# Patient Record
Sex: Male | Born: 1940
Health system: Southern US, Community
[De-identification: ages and names within clinical notes are randomized; demographics above are authoritative.]

## PROBLEM LIST (undated history)

## (undated) DIAGNOSIS — C801 Malignant (primary) neoplasm, unspecified: Secondary | ICD-10-CM

## (undated) DIAGNOSIS — E039 Hypothyroidism, unspecified: Secondary | ICD-10-CM

## (undated) DIAGNOSIS — K8020 Calculus of gallbladder without cholecystitis without obstruction: Secondary | ICD-10-CM

## (undated) DIAGNOSIS — I6529 Occlusion and stenosis of unspecified carotid artery: Secondary | ICD-10-CM

## (undated) DIAGNOSIS — K4090 Unilateral inguinal hernia, without obstruction or gangrene, not specified as recurrent: Secondary | ICD-10-CM

## (undated) DIAGNOSIS — E785 Hyperlipidemia, unspecified: Secondary | ICD-10-CM

## (undated) DIAGNOSIS — K219 Gastro-esophageal reflux disease without esophagitis: Secondary | ICD-10-CM

## (undated) DIAGNOSIS — I10 Essential (primary) hypertension: Secondary | ICD-10-CM

## (undated) DIAGNOSIS — E079 Disorder of thyroid, unspecified: Secondary | ICD-10-CM

## (undated) DIAGNOSIS — E041 Nontoxic single thyroid nodule: Secondary | ICD-10-CM

## (undated) HISTORY — DX: Disorder of thyroid, unspecified: E07.9

## (undated) HISTORY — DX: Nontoxic single thyroid nodule: E04.1

## (undated) HISTORY — DX: Essential (primary) hypertension: I10

## (undated) HISTORY — DX: Hyperlipidemia, unspecified: E78.5

## (undated) HISTORY — PX: HERNIA REPAIR: SHX51

## (undated) HISTORY — PX: CATARACT EXTRACTION W/ INTRAOCULAR LENS  IMPLANT, BILATERAL: SHX1307

## (undated) HISTORY — DX: Hypothyroidism, unspecified: E03.9

## (undated) HISTORY — DX: Gastro-esophageal reflux disease without esophagitis: K21.9

## (undated) HISTORY — PX: TONSILLECTOMY: SUR1361

## (undated) HISTORY — DX: Occlusion and stenosis of unspecified carotid artery: I65.29

---

## 2000-05-25 ENCOUNTER — Ambulatory Visit (HOSPITAL_COMMUNITY): Admission: RE | Admit: 2000-05-25 | Discharge: 2000-05-25 | Payer: Self-pay | Admitting: Family Medicine

## 2000-05-25 ENCOUNTER — Encounter: Payer: Self-pay | Admitting: Family Medicine

## 2004-01-06 ENCOUNTER — Ambulatory Visit (HOSPITAL_COMMUNITY): Admission: RE | Admit: 2004-01-06 | Discharge: 2004-01-06 | Payer: Self-pay | Admitting: Gastroenterology

## 2008-01-31 ENCOUNTER — Emergency Department (HOSPITAL_COMMUNITY): Admission: EM | Admit: 2008-01-31 | Discharge: 2008-01-31 | Payer: Self-pay | Admitting: Emergency Medicine

## 2010-12-07 ENCOUNTER — Ambulatory Visit (HOSPITAL_COMMUNITY)
Admission: RE | Admit: 2010-12-07 | Discharge: 2010-12-07 | Disposition: A | Discharge status: Home / Self Care | Payer: Medicare Other | Source: Ambulatory Visit | Attending: General Surgery | Admitting: General Surgery

## 2010-12-07 ENCOUNTER — Other Ambulatory Visit: Payer: Self-pay | Admitting: General Surgery

## 2010-12-07 ENCOUNTER — Other Ambulatory Visit (HOSPITAL_COMMUNITY): Payer: Self-pay | Admitting: General Surgery

## 2010-12-07 ENCOUNTER — Encounter (HOSPITAL_COMMUNITY): Payer: Medicare Other

## 2010-12-07 DIAGNOSIS — K4090 Unilateral inguinal hernia, without obstruction or gangrene, not specified as recurrent: Secondary | ICD-10-CM | POA: Insufficient documentation

## 2010-12-07 DIAGNOSIS — Z01811 Encounter for preprocedural respiratory examination: Secondary | ICD-10-CM

## 2010-12-07 DIAGNOSIS — M47814 Spondylosis without myelopathy or radiculopathy, thoracic region: Secondary | ICD-10-CM | POA: Insufficient documentation

## 2010-12-07 DIAGNOSIS — Z01812 Encounter for preprocedural laboratory examination: Secondary | ICD-10-CM | POA: Insufficient documentation

## 2010-12-07 DIAGNOSIS — Z01818 Encounter for other preprocedural examination: Secondary | ICD-10-CM | POA: Insufficient documentation

## 2010-12-07 DIAGNOSIS — Z01810 Encounter for preprocedural cardiovascular examination: Secondary | ICD-10-CM | POA: Insufficient documentation

## 2010-12-07 LAB — DIFFERENTIAL
Basophils Absolute: 0 10*3/uL (ref 0.0–0.1)
Basophils Relative: 0 % (ref 0–1)
Eosinophils Absolute: 0.2 10*3/uL (ref 0.0–0.7)
Eosinophils Relative: 2 % (ref 0–5)
Lymphocytes Relative: 32 % (ref 12–46)
Lymphs Abs: 2.2 10*3/uL (ref 0.7–4.0)
Monocytes Absolute: 0.9 10*3/uL (ref 0.1–1.0)
Monocytes Relative: 13 % — ABNORMAL HIGH (ref 3–12)
Neutro Abs: 3.6 10*3/uL (ref 1.7–7.7)
Neutrophils Relative %: 52 % (ref 43–77)

## 2010-12-07 LAB — CBC
HCT: 42.5 % (ref 39.0–52.0)
Hemoglobin: 14.6 g/dL (ref 13.0–17.0)
MCH: 32.3 pg (ref 26.0–34.0)
MCHC: 34.4 g/dL (ref 30.0–36.0)
MCV: 94 fL (ref 78.0–100.0)
Platelets: 200 10*3/uL (ref 150–400)
RBC: 4.52 MIL/uL (ref 4.22–5.81)
RDW: 12.3 % (ref 11.5–15.5)
WBC: 6.9 10*3/uL (ref 4.0–10.5)

## 2010-12-07 LAB — BASIC METABOLIC PANEL
BUN: 15 mg/dL (ref 6–23)
CO2: 30 mEq/L (ref 19–32)
Calcium: 9.4 mg/dL (ref 8.4–10.5)
Chloride: 98 mEq/L (ref 96–112)
Creatinine, Ser: 0.99 mg/dL (ref 0.4–1.5)
GFR calc Af Amer: >60 mL/min (ref >60)
GFR calc non Af Amer: >60 mL/min (ref >60)
Glucose, Bld: 96 mg/dL (ref 70–99)
Potassium: 4.3 mEq/L (ref 3.5–5.1)
Sodium: 134 mEq/L — ABNORMAL LOW (ref 135–145)

## 2010-12-07 LAB — SURGICAL PCR SCREEN
MRSA, PCR: NEGATIVE
Staphylococcus aureus: NEGATIVE

## 2010-12-11 ENCOUNTER — Observation Stay (HOSPITAL_COMMUNITY)
Admission: RE | Admit: 2010-12-11 | Discharge: 2010-12-12 | Disposition: A | Discharge status: Home / Self Care | Payer: Medicare Other | Source: Ambulatory Visit | Attending: General Surgery | Admitting: General Surgery

## 2010-12-11 DIAGNOSIS — K4020 Bilateral inguinal hernia, without obstruction or gangrene, not specified as recurrent: Principal | ICD-10-CM | POA: Insufficient documentation

## 2010-12-27 NOTE — Op Note (Signed)
NAMEELIZANDRO, Mark Ewing             ACCOUNT NO.:  [id number]  MEDICAL RECORD NO.:  [id number]           PATIENT TYPE:  O  LOCATION:  DAYL                         FACILITY:  [facility]  PHYSICIAN:  Mary Sella. Andrey Campanile, MD     DATE OF BIRTH:  10/21/1940  DATE OF PROCEDURE:  12/11/2010 DATE OF DISCHARGE:                              OPERATIVE REPORT   PREOPERATIVE DIAGNOSIS:  Right inguinal hernia.  POSTOPERATIVE DIAGNOSIS:  Bilateral indirect inguinal hernia.  PROCEDURE:  Laparoscopic repair of bilateral indirect inguinal hernia with mesh.  SURGEON:  Mary Sella. Andrey Campanile, MD  ANESTHESIA:  General plus local consisting of 0.25% Marcaine with epi.  FINDINGS:  Upon viewing his pelvis, he had a right indirect inguinal hernia.  He also had a small left indirect inguinal hernia.  I called out to the waiting room and spoke to the patient's wife and son who gave me permission to go ahead and proceed with bilateral hernia repair.  We used 2 pieces of Physio mesh, each was 4 inch x 6 inch, one for each groin.  INDICATIONS FOR PROCEDURE:  The patient is a 69 year old gentleman who developed pain in his right groin about 2-3 weeks ago.  The pain worsened throughout the day and the bulge got larger throughout the daytime.  He was referred by his primary care physician.  We discussed the risks and benefits of hernia repair including bleeding, infection, injury to surrounding structures, testicular loss, chronic inguinal pain, hernia recurrence, hematoma formation, seroma formation, mesh complications requiring explantation, urinary retention, DVT occurrence. He elected to proceed to surgery.  DESCRIPTION OF PROCEDURE:  Retaining informed consent, the patient was brought to the operating room, placed supine on the operating table. General endotracheal anesthesia was established.  Sequential compression devices were placed.  A Foley catheter was placed.  His abdomen and lower groin were prepped and draped  in usual standard surgical fashion. Surgical time-out was performed.  Local was infiltrated at the base of umbilicus.  Next, a 1-inch infraumbilical vertical incision was made. The fascia was grasped anteriorly.  Next, the fascia was incised with #11 blade.  The abdominal cavity was entered.  Pursestring suture consisting of 0 Vicryl on UR-6 was made around the fascial edges.  The Hasson trocar was placed.  Pneumoperitoneum was smoothly established to a patient pressure of 15 mmHg.  The laparoscope was advanced into the abdominal cavity and lifted to the pelvis.  He had a large right indirect inguinal hernia and no evidence of direct hernia on the left. He had no direct hernia.  He had a small left indirect inguinal hernia. At this time, I called into the waiting room and spoke to the patient's family and recommended that we proceed with a bilateral repair since we had evidence of a left inguinal hernia.  They gave permission for [place] to proceed with bilateral repair.  I first started on the patient's right side.  The peritoneum was grasped several inches above the anterior superior iliac spine and retracted downward.  It was then incised with Endo shears and electrocautery.  A lazy S incision was made starting laterally and extending  medial to the medial umbilical ligament.  I then dissected the peritoneal flap down from the anterior abdominal wall. The inferior epigastric vessel was identified and preserved.  Laterally, the peritoneal flap was very difficult to dissect down from the abdominal wall.  I was in the correct plane, but it was just simply fused to the abdominal wall.  I essentially had to cut some thick fibrous strands.  I was able to see through what I was cutting.  There was no evidence of any nerves within the tissue that I was cutting through with Endo shears.  I was eventually able to dissect down and get the flap down.  The pubic bone was identified.  The vas deferens  was identified.  The testicular vessels were identified.  The indirect hernia sac was grasped and retracted outward.  I used traction and counter traction to reduce the hernia sac in its entirety.  There was a rent made in the peritoneum in trying to dissect the hernia sac away from the cord structures.  It was a very difficult tough dissection in the sense that the hernia sac was very well approximated and stuck to the testicular vessels and the vas deferens.  Nonetheless, I was able to eventually free it.  A large flap was made.  However, there was a small rent that was made in the peritoneal flap.  I obtained a piece of Ethicon Physio mesh 4 inch x 6 inch, placed it into the groin.  Half of the mesh covered the medial to the inferior epigastric vessels and the other half covered lateral covering the indirect defect.  The mesh was tacked to the anterior abdominal wall with several fires of the Ethicon Securestrap tacker.  No tacks were placed below the inguinal ligament. No tacks were placed into the pubic bone.  There were 2 tacks placed above the pubic tubercle, one on each side of the inferior epigastric vessels and 2 out laterally.  I then turned my attention to the left side.  The camera was placed on the left side.  It should be noted that prior to starting the hernia repair, I placed 2 additional 5-mm trocars, one on the left and one on the right in the midabdomen at the level of umbilicus in the midclavicular plane all under direct visualization after local had been infiltrated..  The peritoneum was grasped on the left side several inches above the left anterior superior iliac spine and retracted laterally.  Again, a similar lazy S incision was made with Endo shears and carried medially.  The peritoneal flap was dissected downward from the anterior abdominal wall.  This dissected much easier from the abdominal wall as opposed to the right side of the groin.  The vas deferens and  the testicular vessels were identified.  The inferior epigastric vessel was also identified.  The hernia sac was grasped, retracted and reduced with traction and counter traction.  Inferior and posterior to the testicular vessels, there was a rent made in the peritoneal flap.  This one ended up being larger than the right groin. I was able to regain and dissect downward reestablishing continuity of the peritoneum.  However, there was roughly about a 2-3 inch gap in the peritoneum.  However, I was able to create a large pocket.  Again, a piece of Physio 4 inch x 6 inch mesh was placed through the umbilical trocar down into the left groin.  Half of the mesh covered medially to the inferior epigastric vessels  and the other half covered the indirect defect.  It was again tacked to the anterior abdominal wall in similar fashion with a Securestrap, 2 tacks above the pubic tubercle, one on each side of the inferior epigastric vessels and 2 out laterally.  No tacks were placed below the inguinal ligament.  The peritoneal flap on the left was then brought back intact to the anterior abdominal wall using counter traction.  Prior to doing this, we reduced pneumoperitoneum to 8 mmHg.  The mesh was well covered except for the rent in the peritoneal flap below the level of the inguinal ligament.  I then reapproximated the peritoneal flap on the right side of the abdominal wall in a similar fashion to the left.  Again, no tacks were placed through the inferior epigastric vessels.  On the patient's right, I was able to reapproximate the peritoneal flap gap by using 4 Ligamax clips.  With respect to closing the rent in the left groin in the peritoneal flap, the gap was too wide for me to bring both the edges of the peritoneal flap together and secure with Ligamax clip.  Therefore, I obtained 3-0 Vicryl and closed the peritoneal flap in a running fashion using laparoscopic needle driver.  A Lapra-Ty was  placed on the end of the suture to serve as a closure.  The Hasson trocar was removed.  Prior to removing the Hasson trocar, I looked at the abdominal cavity.  There were no signs of any enterotomies.  Both pieces of mesh were well covered.  Pneumoperitoneum was released.  The previously placed pursestring suture was tied down thus layering the fascial defect.  I reestablished pneumoperitoneum.  There was a small gap between the fascia of the umbilicus.  Therefore, I placed 2 additional interrupted 0 Vicryl sutures.  The fascia was completely closed and airtight. Pneumoperitoneum was released and the trocars were removed.  All skin incisions were closed with 4-0 Monocryl in subcuticular fashion.  Dermabond was then applied to the skin incisions.  The Foley catheter was removed.  The patient was extubated and taken to recovery in stable addition.  There were no immediate complications.  The patient tolerated the procedure well.     Mary Sella. Andrey Campanile, MD     EMW/MEDQ  D:  12/11/2010  T:  12/11/2010  Job:  [id number]  Electronically Signed by Gaynelle Adu M.D. on 12/27/2010 09:39:50 AM

## 2011-03-05 NOTE — Op Note (Signed)
NAME:  Mark Ewing, Mark Ewing                       ACCOUNT NO.:  [id number]   MEDICAL RECORD NO.:  [id number]                   PATIENT TYPE:  AMB   LOCATION:  ENDO                                 FACILITY:  [facility]   PHYSICIAN:  Graylin Shiver, M.D.                DATE OF BIRTH:  12/04/1940   DATE OF PROCEDURE:  DATE OF DISCHARGE:                                 OPERATIVE REPORT   PROCEDURE:  Colonoscopy.   ENDOSCOPIST:  Graylin Shiver, M.D.   INDICATIONS FOR PROCEDURE:  Screening.   Informed consent was obtained after explanation of the risks of bleeding  infection and perforation.   PREOPERATIVE MEDICATIONS:  Fentanyl 75 mcg IV, Versed 7 mg IV.   DESCRIPTION OF PROCEDURE:  With the patient in the left lateral decubitus  position a rectal examination was performed and no masses were felt.  The  Olympus colonoscope was inserted into the rectum and advanced around the  very tortuous colon to the cecum.  I had to turn the patient on his right  side to get to the cecum.  The left colon was very tortuous.   The scope was brought out visualizing the mucosa of the cecum and ascending  colon looked normal.  The  transverse colon looked normal.  The descending  colon, sigmoid, and rectum looked normal.   He tolerated the procedure well without complications.   IMPRESSION:  Normal colonoscopy to the cecum.                                               Graylin Shiver, M.D.    Germain Osgood  D:  01/06/2004  T:  01/07/2004  Job:  [id number]   cc:   Meredith Staggers, M.D.  [address]  [place]  [place] [address]  Fax: [phone]

## 2011-03-12 NOTE — Discharge Summary (Signed)
  Mark Ewing, Mark Ewing             ACCOUNT NO.:  [id number]  MEDICAL RECORD NO.:  [id number]           PATIENT TYPE:  I  LOCATION:  1523                         FACILITY:  [facility]  PHYSICIAN:  Mary Sella. Andrey Campanile, MD     DATE OF BIRTH:  January 10, 1941  DATE OF ADMISSION:  12/11/2010 DATE OF DISCHARGE:  12/12/2010                              DISCHARGE SUMMARY   ADMITTING PHYSICIAN:  Mary Sella. Andrey Campanile, MD  DISCHARGE PHYSICIAN:  __________  ADMITTING DIAGNOSES: 1. Right inguinal hernia. 2. Hypertension. 3. Hypercholesterolemia, 4. Gastroesophageal reflux disease.  DISCHARGE DIAGNOSES: 1. Hypertension. 2. Hypercholesterolemia. 3. Gastroesophageal reflux disease. 4. Bilateral indirect inguinal hernia.  PROCEDURES DURING HOSPITALIZATION:  Laparoscopic repair, bilateral indirect inguinal hernia with mesh on December 11, 2010.  BRIEF HOSPITAL COURSE:  The patient was taken to the operating room on December 11, 2010, for above-mentioned procedure.  He at the time of surgery was found to have a small left intractable hernia, which was also repaired at the same time.  His postoperative course was essentially unremarkable.  He did stay overnight because of difficulty urinating.  A Foley was placed for urinary retention.  His Foley catheters were removed the following morning and he was able to void spontaneously.  DISCHARGE INSTRUCTIONS:  The patient was discharged home.  He was instructed to follow up Dr. Andrey Campanile in 2 weeks.  He was going to call for fevers, chills, nausea, vomiting, any signs of wound infection, difficulty urinating for any questions or concerns.  DISCHARGE MEDICATIONS:  Include his home medications of: 1. Nasal saline nasal spray. 2. Aspirin. 3. Calcium citrate with vitamin D. 4. Multivitamin. 5. Ferrous sulfate. 6. Verapamil. 7. Simvastatin. 8. Lisinopril. 9. He was given a prescription for Percocet 5/325 one to two tablets     at needed for every 4 hours as needed  for pain.     Mary Sella. Andrey Campanile, MD     EMW/MEDQ  D:  02/24/2011  T:  02/25/2011  Job:  [id number]  cc:   Tally Joe, M.D.  Electronically Signed by Gaynelle Adu M.D. on 03/12/2011 08:54:38 AM

## 2011-07-13 LAB — BASIC METABOLIC PANEL
GFR calc non Af Amer: >60
Glucose, Bld: 99
Potassium: 4.4
Sodium: 134 — ABNORMAL LOW

## 2011-07-13 LAB — DIFFERENTIAL
Eosinophils Relative: 1
Lymphocytes Relative: 16
Lymphs Abs: 1.2
Monocytes Absolute: 0.7
Monocytes Relative: 9

## 2011-07-13 LAB — CBC
HCT: 46.9
Hemoglobin: 16.3
RBC: 4.85
WBC: 7.8

## 2011-07-13 LAB — HEPATIC FUNCTION PANEL
ALT: 18
Alkaline Phosphatase: 44
Bilirubin, Direct: 0.3
Indirect Bilirubin: 0.9

## 2012-03-27 ENCOUNTER — Other Ambulatory Visit: Payer: Self-pay | Admitting: Family Medicine

## 2012-03-27 DIAGNOSIS — R55 Syncope and collapse: Secondary | ICD-10-CM

## 2012-03-30 ENCOUNTER — Ambulatory Visit
Admission: RE | Admit: 2012-03-30 | Discharge: 2012-03-30 | Disposition: A | Discharge status: Home / Self Care | Payer: Medicare Other | Source: Ambulatory Visit | Attending: Family Medicine | Admitting: Family Medicine

## 2012-03-30 DIAGNOSIS — R55 Syncope and collapse: Secondary | ICD-10-CM

## 2012-03-31 ENCOUNTER — Other Ambulatory Visit: Payer: Medicare Other

## 2012-04-27 ENCOUNTER — Other Ambulatory Visit: Payer: Self-pay | Admitting: Cardiology

## 2012-04-27 DIAGNOSIS — I779 Disorder of arteries and arterioles, unspecified: Secondary | ICD-10-CM

## 2012-05-02 ENCOUNTER — Ambulatory Visit
Admission: RE | Admit: 2012-05-02 | Discharge: 2012-05-02 | Disposition: A | Discharge status: Home / Self Care | Payer: Medicare Other | Source: Ambulatory Visit | Attending: Cardiology | Admitting: Cardiology

## 2012-05-02 DIAGNOSIS — I779 Disorder of arteries and arterioles, unspecified: Secondary | ICD-10-CM

## 2012-05-02 MED ORDER — IOHEXOL 350 MG/ML SOLN
100.0000 mL | Freq: Once | INTRAVENOUS | Status: AC | PRN
  Administered 2012-05-02: 100 mL via INTRAVENOUS

## 2012-05-12 ENCOUNTER — Other Ambulatory Visit: Payer: Self-pay | Admitting: Family Medicine

## 2012-05-12 DIAGNOSIS — E041 Nontoxic single thyroid nodule: Secondary | ICD-10-CM

## 2012-05-22 ENCOUNTER — Other Ambulatory Visit: Payer: Medicare Other

## 2012-05-23 ENCOUNTER — Ambulatory Visit
Admission: RE | Admit: 2012-05-23 | Discharge: 2012-05-23 | Disposition: A | Discharge status: Home / Self Care | Payer: Medicare Other | Source: Ambulatory Visit | Attending: Family Medicine | Admitting: Family Medicine

## 2012-05-23 DIAGNOSIS — E041 Nontoxic single thyroid nodule: Secondary | ICD-10-CM

## 2013-10-05 ENCOUNTER — Other Ambulatory Visit: Payer: Self-pay | Admitting: Internal Medicine

## 2013-10-05 DIAGNOSIS — E041 Nontoxic single thyroid nodule: Secondary | ICD-10-CM

## 2013-10-15 ENCOUNTER — Ambulatory Visit
Admission: RE | Admit: 2013-10-15 | Discharge: 2013-10-15 | Disposition: A | Discharge status: Home / Self Care | Payer: Medicare Other | Source: Ambulatory Visit | Attending: Internal Medicine | Admitting: Internal Medicine

## 2013-10-15 DIAGNOSIS — E041 Nontoxic single thyroid nodule: Secondary | ICD-10-CM

## 2015-04-24 ENCOUNTER — Emergency Department (HOSPITAL_BASED_OUTPATIENT_CLINIC_OR_DEPARTMENT_OTHER)
Admission: EM | Admit: 2015-04-24 | Discharge: 2015-04-24 | Disposition: A | Discharge status: Home / Self Care | Payer: Medicare Other | Attending: Emergency Medicine | Admitting: Emergency Medicine

## 2015-04-24 ENCOUNTER — Emergency Department (HOSPITAL_BASED_OUTPATIENT_CLINIC_OR_DEPARTMENT_OTHER): Payer: Medicare Other

## 2015-04-24 ENCOUNTER — Encounter (HOSPITAL_BASED_OUTPATIENT_CLINIC_OR_DEPARTMENT_OTHER): Payer: Self-pay | Admitting: *Deleted

## 2015-04-24 DIAGNOSIS — E041 Nontoxic single thyroid nodule: Secondary | ICD-10-CM | POA: Insufficient documentation

## 2015-04-24 DIAGNOSIS — I10 Essential (primary) hypertension: Secondary | ICD-10-CM | POA: Diagnosis not present

## 2015-04-24 DIAGNOSIS — Y92003 Bedroom of unspecified non-institutional (private) residence as the place of occurrence of the external cause: Secondary | ICD-10-CM | POA: Insufficient documentation

## 2015-04-24 DIAGNOSIS — Z7982 Long term (current) use of aspirin: Secondary | ICD-10-CM | POA: Diagnosis not present

## 2015-04-24 DIAGNOSIS — W1839XA Other fall on same level, initial encounter: Secondary | ICD-10-CM | POA: Insufficient documentation

## 2015-04-24 DIAGNOSIS — Y998 Other external cause status: Secondary | ICD-10-CM | POA: Diagnosis not present

## 2015-04-24 DIAGNOSIS — R55 Syncope and collapse: Secondary | ICD-10-CM | POA: Insufficient documentation

## 2015-04-24 DIAGNOSIS — S79911A Unspecified injury of right hip, initial encounter: Secondary | ICD-10-CM | POA: Diagnosis not present

## 2015-04-24 DIAGNOSIS — E785 Hyperlipidemia, unspecified: Secondary | ICD-10-CM | POA: Diagnosis not present

## 2015-04-24 DIAGNOSIS — Y9389 Activity, other specified: Secondary | ICD-10-CM | POA: Insufficient documentation

## 2015-04-24 DIAGNOSIS — E039 Hypothyroidism, unspecified: Secondary | ICD-10-CM | POA: Diagnosis not present

## 2015-04-24 DIAGNOSIS — Z8719 Personal history of other diseases of the digestive system: Secondary | ICD-10-CM | POA: Insufficient documentation

## 2015-04-24 DIAGNOSIS — Z79899 Other long term (current) drug therapy: Secondary | ICD-10-CM | POA: Diagnosis not present

## 2015-04-24 DIAGNOSIS — M25551 Pain in right hip: Secondary | ICD-10-CM

## 2015-04-24 MED ORDER — TRAMADOL HCL 50 MG PO TABS
50.0000 mg | ORAL_TABLET | Freq: Four times a day (QID) | ORAL | Status: DC | PRN
Start: 2015-04-24 — End: 2019-05-25

## 2015-04-24 NOTE — ED Provider Notes (Signed)
CSN: [id number]     Arrival date & time 04/24/15  4 History   First MD Initiated Contact with Patient 04/24/15 1105     Chief Complaint  Patient presents with  . Loss of Consciousness     (Consider location/radiation/quality/duration/timing/severity/associated sxs/prior Treatment) Patient is a 74 y.o. male presenting with syncope. The history is provided by the patient and the spouse.  Loss of Consciousness Associated symptoms: no chest pain, no confusion, no dizziness, no fever, no headaches, no shortness of breath and no weakness    patient of woke this morning with a cramp in the right calf get out of bed quickly had a vasovagal episode and passed out briefly probably definitely less than a minute. May be even less than 30 seconds. Patient awoke with the cramps still present when he fell he landed on his right hip and has some pain in that area. No other injuries. Patient came in for concern for the hip was not concerned about the syncopal episode. Patient's been ambulating since then without any further symptoms of the some discomfort in the right hip. Patient does not want workup for the passing out episode.  Past Medical History  Diagnosis Date  . Hypertension   . Hyperlipidemia   . GERD (gastroesophageal reflux disease)   . Carotid artery stenosis     50-70% left  . Thyroid disease   . Hypothyroidism   . Thyroid nodule    History reviewed. No pertinent past surgical history. Family History  Problem Relation Age of Onset  . Hypertension Mother   . Ovarian cancer Mother   . Hypertension Father   . CVA Father   . Prostate cancer Father   . Hypertension Brother   . Hypertension Paternal Grandfather   . CVA Paternal Grandfather    History  Substance Use Topics  . Smoking status: Never Smoker   . Smokeless tobacco: Not on file  . Alcohol Use: No    Review of Systems  Constitutional: Negative for fever.  HENT: Negative for congestion.   Eyes: Negative for visual  disturbance.  Respiratory: Negative for shortness of breath.   Cardiovascular: Positive for syncope. Negative for chest pain.  Gastrointestinal: Negative for abdominal pain.  Genitourinary: Negative for dysuria.  Musculoskeletal: Negative for back pain and neck pain.  Skin: Negative for wound.  Neurological: Positive for syncope. Negative for dizziness, weakness, numbness and headaches.  Hematological: Does not bruise/bleed easily.  Psychiatric/Behavioral: Negative for confusion.      Allergies  Review of patient's allergies indicates no known allergies.  Home Medications   Prior to Admission medications   Medication Sig Start Date End Date Taking? Authorizing Provider  aspirin 81 MG tablet Take 81 mg by mouth daily.    Historical Provider, MD  cetirizine (ZYRTEC) 10 MG tablet Take 10 mg by mouth daily.    Historical Provider, MD  levothyroxine (SYNTHROID, LEVOTHROID) 50 MCG tablet Take 75 mcg by mouth daily before breakfast.     Historical Provider, MD  lisinopril (PRINIVIL,ZESTRIL) 20 MG tablet Take 20 mg by mouth daily.    Historical Provider, MD  sildenafil (VIAGRA) 100 MG tablet Take 100 mg by mouth daily as needed for erectile dysfunction.    Historical Provider, MD  simvastatin (ZOCOR) 10 MG tablet Take 10 mg by mouth daily.    Historical Provider, MD  traMADol (ULTRAM) 50 MG tablet Take 1 tablet (50 mg total) by mouth every 6 (six) hours as needed. 04/24/15   Fredia Sorrow, MD  verapamil (VERELAN PM) 360 MG 24 hr capsule Take 360 mg by mouth at bedtime.    Historical Provider, MD   BP 131/74 mmHg  Pulse 76  Temp(Src) 98 F (36.7 C) (Oral)  Resp 18  SpO2 99% Physical Exam  Constitutional: He is oriented to person, place, and time. He appears well-developed and well-nourished. No distress.  HENT:  Head: Normocephalic and atraumatic.  Mouth/Throat: Oropharynx is clear and moist.  Eyes: Conjunctivae and EOM are normal. Pupils are equal, round, and reactive to light.   Neck: Normal range of motion.  Cardiovascular: Normal rate, regular rhythm and normal heart sounds.   No murmur heard. Pulmonary/Chest: Effort normal and breath sounds normal. No respiratory distress.  Abdominal: Soft. Bowel sounds are normal. There is no tenderness.  Musculoskeletal: Normal range of motion. He exhibits tenderness. He exhibits no edema.  Mild tenderness to palpation to the right iliac crest. No real tenderness over the hip.  Neurological: He is alert and oriented to person, place, and time. No cranial nerve deficit. He exhibits normal muscle tone. Coordination normal.  Skin: Skin is warm. No rash noted.  Nursing note and vitals reviewed.   ED Course  Procedures (including critical care time) Labs Review Labs Reviewed - No data to display  Imaging Review Dg Hips Bilat With Pelvis Min 5 Views  04/24/2015   CLINICAL DATA:  Fall.  Right hip pain  EXAM: BILATERAL HIP (WITH PELVIS) 5-6 VIEWS  COMPARISON:  None.  FINDINGS: There is no evidence of hip fracture or dislocation. There is no evidence of arthropathy or other focal bone abnormality.  IMPRESSION: Negative.   Electronically Signed   By: Franchot Gallo M.D.   On: 04/24/2015 12:09     EKG Interpretation   Date/Time:  Thursday April 24 2015 11:10:01 EDT Ventricular Rate:  70 PR Interval:  148 QRS Duration: 100 QT Interval:  408 QTC Calculation: 440 R Axis:   61 Text Interpretation:  Normal sinus rhythm Normal ECG Confirmed by  Zygmund Passero  MD, Brenetta Penny ([id number]) on 04/24/2015 12:27:57 PM      MDM   Final diagnoses:  Hip pain, acute, right  Syncope, vasovagal    Patient with syncopal episode this morning woke up with a cramp in his right calf cut out of bed passed out very briefly probably less than a minute. Woke up with a cramp. When he fell did land on his right hip and has some pain around the right iliac crest area. X-rays of the hips and pelvis are negative. Patient does not want any blood work regarding the  syncope. Does sound as if it was probably vasovagal. EKG has no evidence of any arrhythmias. Patient is currently asymptomatic has been walking fine. No further feelings of syncope.  Patient will return if he has recurrent episodes of near syncope or syncope understands that that could be significant.  Patient with no other significant injuries.  Treatment for the hip pain OB with tramadol. No evidence of bony injury.    Fredia Sorrow, MD 04/24/15 [phone]

## 2015-04-24 NOTE — ED Notes (Signed)
Pt declines iv access at this time, "I'd rather wait and talk to the doctor first."

## 2015-04-24 NOTE — ED Notes (Signed)
Pt amb to room 7 with quick steady gait, smiling in nad. Pt reports "jumping out of bed" at 5am with a leg cramp, felt "hot" and then awoke with his wife standing over him. Pt states he fell onto a basket with a wire handle, and has pain to his left hip area where the handle hit him. Pt denies any cp, sob or other c/o. Pt states he was able to walk to the kitchen to eat a teaspoon of mustard, which helps with his leg cramps. Pt denies any c/o at this time, states "My hip is just a little sore.Marland KitchenMarland Kitchen"

## 2015-04-24 NOTE — Discharge Instructions (Signed)
Return for any recurrent passing out episodes. X-rays of the hips and pelvis without any bony injuries. Take the tramadol as needed for pain. Okay to walk. Return for any new or worse symptoms.

## 2015-11-17 ENCOUNTER — Other Ambulatory Visit: Payer: Self-pay | Admitting: Internal Medicine

## 2015-11-17 DIAGNOSIS — E042 Nontoxic multinodular goiter: Secondary | ICD-10-CM | POA: Diagnosis not present

## 2015-11-23 ENCOUNTER — Emergency Department (HOSPITAL_BASED_OUTPATIENT_CLINIC_OR_DEPARTMENT_OTHER)
Admission: EM | Admit: 2015-11-23 | Discharge: 2015-11-23 | Disposition: A | Discharge status: Home / Self Care | Payer: Commercial Managed Care - HMO | Attending: Emergency Medicine | Admitting: Emergency Medicine

## 2015-11-23 ENCOUNTER — Encounter (HOSPITAL_BASED_OUTPATIENT_CLINIC_OR_DEPARTMENT_OTHER): Payer: Self-pay | Admitting: Emergency Medicine

## 2015-11-23 ENCOUNTER — Emergency Department (HOSPITAL_BASED_OUTPATIENT_CLINIC_OR_DEPARTMENT_OTHER): Payer: Commercial Managed Care - HMO

## 2015-11-23 DIAGNOSIS — Z9889 Other specified postprocedural states: Secondary | ICD-10-CM | POA: Diagnosis not present

## 2015-11-23 DIAGNOSIS — Z7982 Long term (current) use of aspirin: Secondary | ICD-10-CM | POA: Diagnosis not present

## 2015-11-23 DIAGNOSIS — R1084 Generalized abdominal pain: Secondary | ICD-10-CM

## 2015-11-23 DIAGNOSIS — R1013 Epigastric pain: Secondary | ICD-10-CM | POA: Diagnosis not present

## 2015-11-23 DIAGNOSIS — E041 Nontoxic single thyroid nodule: Secondary | ICD-10-CM | POA: Diagnosis not present

## 2015-11-23 DIAGNOSIS — E039 Hypothyroidism, unspecified: Secondary | ICD-10-CM | POA: Insufficient documentation

## 2015-11-23 DIAGNOSIS — Z8719 Personal history of other diseases of the digestive system: Secondary | ICD-10-CM | POA: Insufficient documentation

## 2015-11-23 DIAGNOSIS — Z79899 Other long term (current) drug therapy: Secondary | ICD-10-CM | POA: Diagnosis not present

## 2015-11-23 DIAGNOSIS — I10 Essential (primary) hypertension: Secondary | ICD-10-CM | POA: Diagnosis not present

## 2015-11-23 DIAGNOSIS — E785 Hyperlipidemia, unspecified: Secondary | ICD-10-CM | POA: Diagnosis not present

## 2015-11-23 DIAGNOSIS — R109 Unspecified abdominal pain: Secondary | ICD-10-CM | POA: Diagnosis not present

## 2015-11-23 LAB — COMPREHENSIVE METABOLIC PANEL
ALK PHOS: 45 U/L (ref 38–126)
ALT: 16 U/L — AB (ref 17–63)
AST: 27 U/L (ref 15–41)
Albumin: 3.6 g/dL (ref 3.5–5.0)
Anion gap: 8 (ref 5–15)
BILIRUBIN TOTAL: 0.4 mg/dL (ref 0.3–1.2)
BUN: 16 mg/dL (ref 6–20)
CALCIUM: 9.3 mg/dL (ref 8.9–10.3)
CHLORIDE: 99 mmol/L — AB (ref 101–111)
CO2: 25 mmol/L (ref 22–32)
CREATININE: 0.98 mg/dL (ref 0.61–1.24)
Glucose, Bld: 141 mg/dL — ABNORMAL HIGH (ref 65–99)
Potassium: 3.8 mmol/L (ref 3.5–5.1)
Sodium: 132 mmol/L — ABNORMAL LOW (ref 135–145)
TOTAL PROTEIN: 6 g/dL — AB (ref 6.5–8.1)

## 2015-11-23 LAB — URINALYSIS, ROUTINE W REFLEX MICROSCOPIC
BILIRUBIN URINE: NEGATIVE
Glucose, UA: NEGATIVE mg/dL
Hgb urine dipstick: NEGATIVE
Ketones, ur: NEGATIVE mg/dL
Leukocytes, UA: NEGATIVE
NITRITE: NEGATIVE
PH: 7 (ref 5.0–8.0)
Protein, ur: NEGATIVE mg/dL
SPECIFIC GRAVITY, URINE: 1.018 (ref 1.005–1.030)

## 2015-11-23 LAB — CBC
HEMATOCRIT: 32.9 % — AB (ref 39.0–52.0)
HEMOGLOBIN: 11.1 g/dL — AB (ref 13.0–17.0)
MCH: 28.6 pg (ref 26.0–34.0)
MCHC: 33.7 g/dL (ref 30.0–36.0)
MCV: 84.8 fL (ref 78.0–100.0)
Platelets: 317 10*3/uL (ref 150–400)
RBC: 3.88 MIL/uL — ABNORMAL LOW (ref 4.22–5.81)
RDW: 14.1 % (ref 11.5–15.5)
WBC: 12.5 10*3/uL — AB (ref 4.0–10.5)

## 2015-11-23 LAB — LIPASE, BLOOD: Lipase: 23 U/L (ref 11–51)

## 2015-11-23 MED ORDER — DICYCLOMINE HCL 10 MG PO CAPS
20.0000 mg | ORAL_CAPSULE | Freq: Once | ORAL | Status: AC
  Administered 2015-11-23: 20 mg via ORAL
  Filled 2015-11-23: qty 2

## 2015-11-23 MED ORDER — IOHEXOL 300 MG/ML  SOLN
25.0000 mL | Freq: Once | INTRAMUSCULAR | Status: AC | PRN
  Administered 2015-11-23: 25 mL via ORAL

## 2015-11-23 MED ORDER — SODIUM CHLORIDE 0.9 % IV BOLUS (SEPSIS)
1000.0000 mL | Freq: Once | INTRAVENOUS | Status: AC
  Administered 2015-11-23: 1000 mL via INTRAVENOUS

## 2015-11-23 MED ORDER — MAGNESIUM CITRATE PO SOLN
1.0000 | Freq: Once | ORAL | Status: AC
  Administered 2015-11-23: 1 via ORAL
  Filled 2015-11-23: qty 296

## 2015-11-23 MED ORDER — MORPHINE SULFATE (PF) 4 MG/ML IV SOLN
4.0000 mg | Freq: Once | INTRAVENOUS | Status: AC
  Administered 2015-11-23: 4 mg via INTRAVENOUS
  Filled 2015-11-23: qty 1

## 2015-11-23 MED ORDER — IOHEXOL 300 MG/ML  SOLN
100.0000 mL | Freq: Once | INTRAMUSCULAR | Status: AC | PRN
  Administered 2015-11-23: 100 mL via INTRAVENOUS

## 2015-11-23 NOTE — ED Notes (Signed)
Pt states abd pain that started around 11/22/15 '@1800'$ . Pt reports taking tums and OTC meds at home with no relief. Denies nausea, vomiting, diarrhea, or fever.

## 2015-11-23 NOTE — ED Provider Notes (Signed)
CSN: [id number]     Arrival date & time 11/23/15  0150 History   First MD Initiated Contact with Patient 11/23/15 [phone]     Chief Complaint  Patient presents with  . Abdominal Pain     (Consider location/radiation/quality/duration/timing/severity/associated sxs/prior Treatment) HPI  Mark Ewing is a 75yo male, PMH HTN, HLD, presenting today with sudden onset abdominal pain.  He had a greasy meal this afternoon around 2pm and developed pain.  He denies any N/V/D.  He states the pain is a ppressure, midepigastric and non radiating.  He states he gets gas often, but this is much worse.  It feels similar to when he had a hernia.  He tried tums and tylenol with no relief.  Pain continued to worsen this evening.    10 Systems reviewed and are negative for acute change except as noted in the HPI.     Past Medical History  Diagnosis Date  . Hypertension   . Hyperlipidemia   . GERD (gastroesophageal reflux disease)   . Carotid artery stenosis     50-70% left  . Thyroid disease   . Hypothyroidism   . Thyroid nodule    History reviewed. No pertinent past surgical history. Family History  Problem Relation Age of Onset  . Hypertension Mother   . Ovarian cancer Mother   . Hypertension Father   . CVA Father   . Prostate cancer Father   . Hypertension Brother   . Hypertension Paternal Grandfather   . CVA Paternal Grandfather    Social History  Substance Use Topics  . Smoking status: Never Smoker   . Smokeless tobacco: None  . Alcohol Use: No    Review of Systems    Allergies  Review of patient's allergies indicates no known allergies.  Home Medications   Prior to Admission medications   Medication Sig Start Date End Date Taking? Authorizing Provider  aspirin 81 MG tablet Take 81 mg by mouth daily.    Historical Provider, MD  cetirizine (ZYRTEC) 10 MG tablet Take 10 mg by mouth daily.    Historical Provider, MD  levothyroxine (SYNTHROID, LEVOTHROID) 50 MCG tablet Take 75 mcg  by mouth daily before breakfast.     Historical Provider, MD  lisinopril (PRINIVIL,ZESTRIL) 20 MG tablet Take 20 mg by mouth daily.    Historical Provider, MD  sildenafil (VIAGRA) 100 MG tablet Take 100 mg by mouth daily as needed for erectile dysfunction.    Historical Provider, MD  simvastatin (ZOCOR) 10 MG tablet Take 10 mg by mouth daily.    Historical Provider, MD  traMADol (ULTRAM) 50 MG tablet Take 1 tablet (50 mg total) by mouth every 6 (six) hours as needed. 04/24/15   Fredia Sorrow, MD  verapamil (VERELAN PM) 360 MG 24 hr capsule Take 360 mg by mouth at bedtime.    Historical Provider, MD   BP 157/76 mmHg  Pulse 87  Temp(Src) 98.6 F (37 C) (Oral)  Resp 19  Ht '5\' 9"'$  (1.753 m)  Wt 183 lb (83.008 kg)  BMI 27.01 kg/m2  SpO2 100% Physical Exam  Constitutional: He is oriented to person, place, and time. Vital signs are normal. He appears well-developed and well-nourished.  Non-toxic appearance. He does not appear ill. No distress.  HENT:  Head: Normocephalic and atraumatic.  Nose: Nose normal.  Mouth/Throat: Oropharynx is clear and moist. No oropharyngeal exudate.  Eyes: Conjunctivae and EOM are normal. Pupils are equal, round, and reactive to light. No scleral icterus.  Neck: Normal  range of motion. Neck supple. No tracheal deviation, no edema, no erythema and normal range of motion present. No thyroid mass and no thyromegaly present.  Cardiovascular: Normal rate, regular rhythm, S1 normal, S2 normal, normal heart sounds, intact distal pulses and normal pulses.  Exam reveals no gallop and no friction rub.   No murmur heard. Pulmonary/Chest: Effort normal and breath sounds normal. No respiratory distress. He has no wheezes. He has no rhonchi. He has no rales.  Abdominal: Soft. Normal appearance and bowel sounds are normal. He exhibits no distension, no ascites and no mass. There is no hepatosplenomegaly. There is no tenderness. There is no rebound, no guarding and no CVA tenderness.   Musculoskeletal: Normal range of motion. He exhibits no edema or tenderness.  Lymphadenopathy:    He has no cervical adenopathy.  Neurological: He is alert and oriented to person, place, and time. He has normal strength. No cranial nerve deficit or sensory deficit.  Skin: Skin is warm, dry and intact. No petechiae and no rash noted. He is not diaphoretic. No erythema. No pallor.  Psychiatric: He has a normal mood and affect. His behavior is normal. Judgment normal.  Nursing note and vitals reviewed.   ED Course  Procedures (including critical care time) Labs Review Labs Reviewed  COMPREHENSIVE METABOLIC PANEL - Abnormal; Notable for the following:    Sodium 132 (*)    Chloride 99 (*)    Glucose, Bld 141 (*)    Total Protein 6.0 (*)    ALT 16 (*)    All other components within normal limits  CBC - Abnormal; Notable for the following:    WBC 12.5 (*)    RBC 3.88 (*)    Hemoglobin 11.1 (*)    HCT 32.9 (*)    All other components within normal limits  URINALYSIS, ROUTINE W REFLEX MICROSCOPIC (NOT AT [facility]) - Abnormal; Notable for the following:    APPearance CLOUDY (*)    All other components within normal limits  LIPASE, BLOOD    Imaging Review Ct Abdomen Pelvis W Contrast  11/23/2015  CLINICAL DATA:  Mid abdominal pain. History of hernia. Concern for small bowel obstruction. EXAM: CT ABDOMEN AND PELVIS WITH CONTRAST TECHNIQUE: Multidetector CT imaging of the abdomen and pelvis was performed using the standard protocol following bolus administration of intravenous contrast. CONTRAST:  100 mL Omnipaque 300 IV COMPARISON:  None. FINDINGS: Lower chest: The included lung bases are clear. Normal heart size, coronary artery calcifications are seen. Liver: Tiny subcapsular hypodensity in the inferior right lobe. No suspicious hepatic abnormality. Hepatobiliary: Gallbladder physiologically distended, and contains a small dependent gallstone. No pericholecystic inflammatory change. No biliary  dilatation. Pancreas: Normal. Spleen: Normal. Adrenal glands: 1.7 cm left adrenal nodule. Right adrenal gland is normal. Kidneys: Bilateral parapelvic cysts without hydronephrosis. Small cortical cysts. No perinephric stranding. No urolithiasis. Stomach/Bowel: Stomach physiologically distended with ingested contents. There are no dilated or thickened small bowel loops. No bowel obstruction. Moderate volume of stool throughout the colon without colonic wall thickening. Significant sigmoid colonic tortuosity. The appendix is normal. Vascular/Lymphatic: No retroperitoneal adenopathy. Abdominal aorta is normal in caliber. Moderate atherosclerosis without aneurysm. Reproductive: Prostate gland normal for age. Bladder: Distended.  No wall thickening. Other: No free air, free fluid, or intra-abdominal fluid collection. Small fat containing umbilical hernia. No inguinal hernia. Musculoskeletal: There are no acute or suspicious osseous abnormalities. Degenerative change in the included spine. IMPRESSION: 1. No bowel obstruction. 2. Moderate stool burden with significant sigmoid colonic tortuosity. Findings  suggest chronic constipation. 3. Gallstone without gallbladder inflammation. Electronically Signed   By: Jeb Levering M.D.   On: 11/23/2015 03:45   I have personally reviewed and evaluated these images and lab results as part of my medical decision-making.   EKG Interpretation None      MDM   Final diagnoses:  None   Patient presents to the ED for abdominal pain.  He is at increased risk for intraabdominal emergency due to his age and risk factors.  Will obtain CT scan for evaluation. He was given morphine, bentyl, and IVF.  Labs reveal only leukocytosis which is non specific.  CT scan only shows moderate constipation. Possibly the cause of the patient's pain.  Gastritis is possible as well.  Will give mag citrate prior to DC to help move his bowels at home.  I doubt mesenteric ischemia as his history  is not consistent and he has no risk factors for it.  He states his pain has resolved, he appears comfortable and in NAD.  VS remain within his normal limits and he is safe for DC.    Everlene Balls, MD 11/23/15 [phone]

## 2015-11-23 NOTE — Discharge Instructions (Signed)
Constipation, Adult Mark Ewing, your CT scan results are below.  See your primary care doctor within 3 days for close follow up.  There are dietary changes you can make below to help with constipation.  If symptoms return, come back to the ED immediately.  Thank you.  IMPRESSION: 1. No bowel obstruction. 2. Moderate stool burden with significant sigmoid colonic tortuosity. Findings suggest chronic constipation. 3. Gallstone without gallbladder inflammation.  Constipation is when a person:  Poops (has a bowel movement) less than 3 times a week.  Has a hard time pooping.  Has poop that is dry, hard, or bigger than normal. HOME CARE   Eat foods with a lot of fiber in them. This includes fruits, vegetables, beans, and whole grains such as brown rice.  Avoid fatty foods and foods with a lot of sugar. This includes french fries, hamburgers, cookies, candy, and soda.  If you are not getting enough fiber from food, take products with added fiber in them (supplements).  Drink enough fluid to keep your pee (urine) clear or pale yellow.  Exercise on a regular basis, or as told by your doctor.  Go to the restroom when you feel like you need to poop. Do not hold it.  Only take medicine as told by your doctor. Do not take medicines that help you poop (laxatives) without talking to your doctor first. GET HELP RIGHT AWAY IF:   You have bright red blood in your poop (stool).  Your constipation lasts more than 4 days or gets worse.  You have belly (abdominal) or butt (rectal) pain.  You have thin poop (as thin as a pencil).  You lose weight, and it cannot be explained. MAKE SURE YOU:   Understand these instructions.  Will watch your condition.  Will get help right away if you are not doing well or get worse.   This information is not intended to replace advice given to you by your health care provider. Make sure you discuss any questions you have with your health care provider.     Document Released: 03/22/2008 Document Revised: 10/25/2014 Document Reviewed: 07/16/2013 Elsevier Interactive Patient Education 2016 Elsevier Inc. High-Fiber Diet Fiber, also called dietary fiber, is a type of carbohydrate found in fruits, vegetables, whole grains, and beans. A high-fiber diet can have many health benefits. Your health care provider may recommend a high-fiber diet to help:  Prevent constipation. Fiber can make your bowel movements more regular.  Lower your cholesterol.  Relieve hemorrhoids, uncomplicated diverticulosis, or irritable bowel syndrome.  Prevent overeating as part of a weight-loss plan.  Prevent heart disease, type 2 diabetes, and certain cancers. WHAT IS MY PLAN? The recommended daily intake of fiber includes:  38 grams for men under age 100.  51 grams for men over age 84.  46 grams for women under age 63.  28 grams for women over age 81. You can get the recommended daily intake of dietary fiber by eating a variety of fruits, vegetables, grains, and beans. Your health care provider may also recommend a fiber supplement if it is not possible to get enough fiber through your diet. WHAT DO I NEED TO KNOW ABOUT A HIGH-FIBER DIET?  Fiber supplements have not been widely studied for their effectiveness, so it is better to get fiber through food sources.  Always check the fiber content on thenutrition facts label of any prepackaged food. Look for foods that contain at least 5 grams of fiber per serving.  Ask your dietitian  if you have questions about specific foods that are related to your condition, especially if those foods are not listed in the following section.  Increase your daily fiber consumption gradually. Increasing your intake of dietary fiber too quickly may cause bloating, cramping, or gas.  Drink plenty of water. Water helps you to digest fiber. WHAT FOODS CAN I EAT? Grains Whole-grain breads. Multigrain cereal. Oats and oatmeal. Brown  rice. Barley. Bulgur wheat. [place]. Bran muffins. Popcorn. Rye wafer crackers. Vegetables Sweet potatoes. Spinach. Kale. Artichokes. Cabbage. Broccoli. Green peas. Carrots. Squash. Fruits Berries. Pears. Apples. Oranges. Avocados. Prunes and raisins. Dried figs. Meats and Other Protein Sources Navy, kidney, pinto, and soy beans. Split peas. Lentils. Nuts and seeds. Dairy Fiber-fortified yogurt. Beverages Fiber-fortified soy milk. Fiber-fortified orange juice. Other Fiber bars. The items listed above may not be a complete list of recommended foods or beverages. Contact your dietitian for more options. WHAT FOODS ARE NOT RECOMMENDED? Grains White bread. Pasta made with refined flour. White rice. Vegetables Fried potatoes. Canned vegetables. Well-cooked vegetables.  Fruits Fruit juice. Cooked, strained fruit. Meats and Other Protein Sources Fatty cuts of meat. Fried Sales executive or fried fish. Dairy Milk. Yogurt. Cream cheese. Sour cream. Beverages Soft drinks. Other Cakes and pastries. Butter and oils. The items listed above may not be a complete list of foods and beverages to avoid. Contact your dietitian for more information. WHAT ARE SOME TIPS FOR INCLUDING HIGH-FIBER FOODS IN MY DIET?  Eat a wide variety of high-fiber foods.  Make sure that half of all grains consumed each day are whole grains.  Replace breads and cereals made from refined flour or white flour with whole-grain breads and cereals.  Replace white rice with brown rice, bulgur wheat, or millet.  Start the day with a breakfast that is high in fiber, such as a cereal that contains at least 5 grams of fiber per serving.  Use beans in place of meat in soups, salads, or pasta.  Eat high-fiber snacks, such as berries, raw vegetables, nuts, or popcorn.   This information is not intended to replace advice given to you by your health care provider. Make sure you discuss any questions you have with your health care  provider.   Document Released: 10/04/2005 Document Revised: 10/25/2014 Document Reviewed: 03/19/2014 Elsevier Interactive Patient Education [employer].

## 2015-12-23 ENCOUNTER — Ambulatory Visit
Admission: RE | Admit: 2015-12-23 | Discharge: 2015-12-23 | Disposition: A | Discharge status: Home / Self Care | Payer: Commercial Managed Care - HMO | Source: Ambulatory Visit | Attending: Family Medicine | Admitting: Family Medicine

## 2015-12-23 ENCOUNTER — Other Ambulatory Visit: Payer: Self-pay | Admitting: Family Medicine

## 2015-12-23 DIAGNOSIS — M25551 Pain in right hip: Secondary | ICD-10-CM

## 2015-12-23 DIAGNOSIS — M1611 Unilateral primary osteoarthritis, right hip: Secondary | ICD-10-CM | POA: Diagnosis not present

## 2015-12-23 DIAGNOSIS — B351 Tinea unguium: Secondary | ICD-10-CM | POA: Diagnosis not present

## 2016-02-16 ENCOUNTER — Ambulatory Visit
Admission: RE | Admit: 2016-02-16 | Discharge: 2016-02-16 | Disposition: A | Discharge status: Home / Self Care | Payer: Commercial Managed Care - HMO | Source: Ambulatory Visit | Attending: Internal Medicine | Admitting: Internal Medicine

## 2016-02-16 DIAGNOSIS — E042 Nontoxic multinodular goiter: Secondary | ICD-10-CM

## 2016-02-16 DIAGNOSIS — E041 Nontoxic single thyroid nodule: Secondary | ICD-10-CM | POA: Diagnosis not present

## 2016-03-01 DIAGNOSIS — E782 Mixed hyperlipidemia: Secondary | ICD-10-CM | POA: Diagnosis not present

## 2016-03-01 DIAGNOSIS — E039 Hypothyroidism, unspecified: Secondary | ICD-10-CM | POA: Diagnosis not present

## 2016-03-01 DIAGNOSIS — S0001XD Abrasion of scalp, subsequent encounter: Secondary | ICD-10-CM | POA: Diagnosis not present

## 2016-03-01 DIAGNOSIS — M1611 Unilateral primary osteoarthritis, right hip: Secondary | ICD-10-CM | POA: Diagnosis not present

## 2016-03-01 DIAGNOSIS — N529 Male erectile dysfunction, unspecified: Secondary | ICD-10-CM | POA: Diagnosis not present

## 2016-03-01 DIAGNOSIS — I10 Essential (primary) hypertension: Secondary | ICD-10-CM | POA: Diagnosis not present

## 2016-03-01 DIAGNOSIS — Z0000 Encounter for general adult medical examination without abnormal findings: Secondary | ICD-10-CM | POA: Diagnosis not present

## 2016-03-01 DIAGNOSIS — D649 Anemia, unspecified: Secondary | ICD-10-CM | POA: Diagnosis not present

## 2016-03-01 DIAGNOSIS — Z125 Encounter for screening for malignant neoplasm of prostate: Secondary | ICD-10-CM | POA: Diagnosis not present

## 2016-03-29 DIAGNOSIS — D649 Anemia, unspecified: Secondary | ICD-10-CM | POA: Diagnosis not present

## 2016-05-17 DIAGNOSIS — I10 Essential (primary) hypertension: Secondary | ICD-10-CM | POA: Diagnosis not present

## 2016-05-17 DIAGNOSIS — H524 Presbyopia: Secondary | ICD-10-CM | POA: Diagnosis not present

## 2016-05-17 DIAGNOSIS — H5210 Myopia, unspecified eye: Secondary | ICD-10-CM | POA: Diagnosis not present

## 2016-07-15 DIAGNOSIS — Z23 Encounter for immunization: Secondary | ICD-10-CM | POA: Diagnosis not present

## 2016-08-04 DIAGNOSIS — S50812A Abrasion of left forearm, initial encounter: Secondary | ICD-10-CM | POA: Diagnosis not present

## 2016-08-04 DIAGNOSIS — R079 Chest pain, unspecified: Secondary | ICD-10-CM | POA: Diagnosis not present

## 2016-08-04 DIAGNOSIS — E079 Disorder of thyroid, unspecified: Secondary | ICD-10-CM | POA: Diagnosis not present

## 2016-08-04 DIAGNOSIS — R0789 Other chest pain: Secondary | ICD-10-CM | POA: Diagnosis not present

## 2016-08-04 DIAGNOSIS — I10 Essential (primary) hypertension: Secondary | ICD-10-CM | POA: Diagnosis not present

## 2016-08-09 DIAGNOSIS — R42 Dizziness and giddiness: Secondary | ICD-10-CM | POA: Diagnosis not present

## 2016-08-10 ENCOUNTER — Other Ambulatory Visit: Payer: Self-pay | Admitting: Family Medicine

## 2016-08-10 ENCOUNTER — Ambulatory Visit
Admission: RE | Admit: 2016-08-10 | Discharge: 2016-08-10 | Disposition: A | Discharge status: Home / Self Care | Payer: Commercial Managed Care - HMO | Source: Ambulatory Visit | Attending: Family Medicine | Admitting: Family Medicine

## 2016-08-10 DIAGNOSIS — R1010 Upper abdominal pain, unspecified: Secondary | ICD-10-CM

## 2016-08-10 DIAGNOSIS — M1611 Unilateral primary osteoarthritis, right hip: Secondary | ICD-10-CM | POA: Diagnosis not present

## 2016-08-10 DIAGNOSIS — R55 Syncope and collapse: Secondary | ICD-10-CM

## 2016-08-10 DIAGNOSIS — I10 Essential (primary) hypertension: Secondary | ICD-10-CM | POA: Diagnosis not present

## 2016-08-10 DIAGNOSIS — R109 Unspecified abdominal pain: Secondary | ICD-10-CM | POA: Diagnosis not present

## 2016-08-10 DIAGNOSIS — N529 Male erectile dysfunction, unspecified: Secondary | ICD-10-CM | POA: Diagnosis not present

## 2016-08-10 DIAGNOSIS — E782 Mixed hyperlipidemia: Secondary | ICD-10-CM | POA: Diagnosis not present

## 2016-08-10 DIAGNOSIS — E039 Hypothyroidism, unspecified: Secondary | ICD-10-CM | POA: Diagnosis not present

## 2016-08-10 DIAGNOSIS — R1084 Generalized abdominal pain: Secondary | ICD-10-CM | POA: Diagnosis not present

## 2016-08-10 DIAGNOSIS — K219 Gastro-esophageal reflux disease without esophagitis: Secondary | ICD-10-CM | POA: Diagnosis not present

## 2016-08-19 DIAGNOSIS — E782 Mixed hyperlipidemia: Secondary | ICD-10-CM | POA: Diagnosis not present

## 2016-08-19 DIAGNOSIS — M1611 Unilateral primary osteoarthritis, right hip: Secondary | ICD-10-CM | POA: Diagnosis not present

## 2016-08-19 DIAGNOSIS — N529 Male erectile dysfunction, unspecified: Secondary | ICD-10-CM | POA: Diagnosis not present

## 2016-08-19 DIAGNOSIS — D649 Anemia, unspecified: Secondary | ICD-10-CM | POA: Diagnosis not present

## 2016-08-19 DIAGNOSIS — I10 Essential (primary) hypertension: Secondary | ICD-10-CM | POA: Diagnosis not present

## 2016-08-19 DIAGNOSIS — E039 Hypothyroidism, unspecified: Secondary | ICD-10-CM | POA: Diagnosis not present

## 2016-10-04 DIAGNOSIS — H903 Sensorineural hearing loss, bilateral: Secondary | ICD-10-CM | POA: Diagnosis not present

## 2016-10-04 DIAGNOSIS — H9313 Tinnitus, bilateral: Secondary | ICD-10-CM | POA: Diagnosis not present

## 2016-10-13 DIAGNOSIS — H903 Sensorineural hearing loss, bilateral: Secondary | ICD-10-CM | POA: Diagnosis not present

## 2017-01-25 DIAGNOSIS — J209 Acute bronchitis, unspecified: Secondary | ICD-10-CM | POA: Diagnosis not present

## 2017-02-14 DIAGNOSIS — R05 Cough: Secondary | ICD-10-CM | POA: Diagnosis not present

## 2017-02-14 DIAGNOSIS — R6889 Other general symptoms and signs: Secondary | ICD-10-CM | POA: Diagnosis not present

## 2017-02-14 DIAGNOSIS — I10 Essential (primary) hypertension: Secondary | ICD-10-CM | POA: Diagnosis not present

## 2017-02-15 ENCOUNTER — Other Ambulatory Visit: Payer: Self-pay | Admitting: Family Medicine

## 2017-02-15 ENCOUNTER — Ambulatory Visit
Admission: RE | Admit: 2017-02-15 | Discharge: 2017-02-15 | Disposition: A | Discharge status: Home / Self Care | Payer: Commercial Managed Care - HMO | Source: Ambulatory Visit | Attending: Family Medicine | Admitting: Family Medicine

## 2017-02-15 DIAGNOSIS — R059 Cough, unspecified: Secondary | ICD-10-CM

## 2017-02-15 DIAGNOSIS — R05 Cough: Secondary | ICD-10-CM

## 2017-03-17 DIAGNOSIS — Z125 Encounter for screening for malignant neoplasm of prostate: Secondary | ICD-10-CM | POA: Diagnosis not present

## 2017-03-17 DIAGNOSIS — E039 Hypothyroidism, unspecified: Secondary | ICD-10-CM | POA: Diagnosis not present

## 2017-03-17 DIAGNOSIS — E782 Mixed hyperlipidemia: Secondary | ICD-10-CM | POA: Diagnosis not present

## 2017-03-17 DIAGNOSIS — N529 Male erectile dysfunction, unspecified: Secondary | ICD-10-CM | POA: Diagnosis not present

## 2017-03-17 DIAGNOSIS — Z0000 Encounter for general adult medical examination without abnormal findings: Secondary | ICD-10-CM | POA: Diagnosis not present

## 2017-03-17 DIAGNOSIS — M1611 Unilateral primary osteoarthritis, right hip: Secondary | ICD-10-CM | POA: Diagnosis not present

## 2017-03-17 DIAGNOSIS — Z1211 Encounter for screening for malignant neoplasm of colon: Secondary | ICD-10-CM | POA: Diagnosis not present

## 2017-03-17 DIAGNOSIS — Z1389 Encounter for screening for other disorder: Secondary | ICD-10-CM | POA: Diagnosis not present

## 2017-03-17 DIAGNOSIS — I10 Essential (primary) hypertension: Secondary | ICD-10-CM | POA: Diagnosis not present

## 2017-04-18 DIAGNOSIS — D649 Anemia, unspecified: Secondary | ICD-10-CM | POA: Diagnosis not present

## 2017-05-12 DIAGNOSIS — D649 Anemia, unspecified: Secondary | ICD-10-CM | POA: Diagnosis not present

## 2017-05-19 DIAGNOSIS — D649 Anemia, unspecified: Secondary | ICD-10-CM | POA: Diagnosis not present

## 2017-06-07 DIAGNOSIS — H5210 Myopia, unspecified eye: Secondary | ICD-10-CM | POA: Diagnosis not present

## 2017-07-19 DIAGNOSIS — Z23 Encounter for immunization: Secondary | ICD-10-CM | POA: Diagnosis not present

## 2017-08-09 DIAGNOSIS — H25012 Cortical age-related cataract, left eye: Secondary | ICD-10-CM | POA: Diagnosis not present

## 2017-08-09 DIAGNOSIS — H25043 Posterior subcapsular polar age-related cataract, bilateral: Secondary | ICD-10-CM | POA: Diagnosis not present

## 2017-08-09 DIAGNOSIS — H02839 Dermatochalasis of unspecified eye, unspecified eyelid: Secondary | ICD-10-CM | POA: Diagnosis not present

## 2017-08-09 DIAGNOSIS — H2512 Age-related nuclear cataract, left eye: Secondary | ICD-10-CM | POA: Diagnosis not present

## 2017-08-09 DIAGNOSIS — H2513 Age-related nuclear cataract, bilateral: Secondary | ICD-10-CM | POA: Diagnosis not present

## 2017-08-09 DIAGNOSIS — H35373 Puckering of macula, bilateral: Secondary | ICD-10-CM | POA: Diagnosis not present

## 2017-08-09 DIAGNOSIS — H25013 Cortical age-related cataract, bilateral: Secondary | ICD-10-CM | POA: Diagnosis not present

## 2017-09-15 DIAGNOSIS — E039 Hypothyroidism, unspecified: Secondary | ICD-10-CM | POA: Diagnosis not present

## 2017-09-15 DIAGNOSIS — M722 Plantar fascial fibromatosis: Secondary | ICD-10-CM | POA: Diagnosis not present

## 2017-09-15 DIAGNOSIS — N529 Male erectile dysfunction, unspecified: Secondary | ICD-10-CM | POA: Diagnosis not present

## 2017-09-15 DIAGNOSIS — E782 Mixed hyperlipidemia: Secondary | ICD-10-CM | POA: Diagnosis not present

## 2017-09-15 DIAGNOSIS — E042 Nontoxic multinodular goiter: Secondary | ICD-10-CM | POA: Diagnosis not present

## 2017-09-15 DIAGNOSIS — I10 Essential (primary) hypertension: Secondary | ICD-10-CM | POA: Diagnosis not present

## 2017-09-23 DIAGNOSIS — H2511 Age-related nuclear cataract, right eye: Secondary | ICD-10-CM | POA: Diagnosis not present

## 2017-09-23 DIAGNOSIS — H2512 Age-related nuclear cataract, left eye: Secondary | ICD-10-CM | POA: Diagnosis not present

## 2017-09-29 DIAGNOSIS — H2512 Age-related nuclear cataract, left eye: Secondary | ICD-10-CM | POA: Diagnosis not present

## 2017-10-07 DIAGNOSIS — H2511 Age-related nuclear cataract, right eye: Secondary | ICD-10-CM | POA: Diagnosis not present

## 2017-10-13 DIAGNOSIS — H2511 Age-related nuclear cataract, right eye: Secondary | ICD-10-CM | POA: Diagnosis not present

## 2017-11-17 DIAGNOSIS — Z0100 Encounter for examination of eyes and vision without abnormal findings: Secondary | ICD-10-CM | POA: Diagnosis not present

## 2018-01-05 DIAGNOSIS — M5416 Radiculopathy, lumbar region: Secondary | ICD-10-CM | POA: Diagnosis not present

## 2018-05-08 DIAGNOSIS — Z1211 Encounter for screening for malignant neoplasm of colon: Secondary | ICD-10-CM | POA: Diagnosis not present

## 2018-05-08 DIAGNOSIS — E042 Nontoxic multinodular goiter: Secondary | ICD-10-CM | POA: Diagnosis not present

## 2018-05-08 DIAGNOSIS — I10 Essential (primary) hypertension: Secondary | ICD-10-CM | POA: Diagnosis not present

## 2018-05-08 DIAGNOSIS — Z125 Encounter for screening for malignant neoplasm of prostate: Secondary | ICD-10-CM | POA: Diagnosis not present

## 2018-05-08 DIAGNOSIS — Z0000 Encounter for general adult medical examination without abnormal findings: Secondary | ICD-10-CM | POA: Diagnosis not present

## 2018-05-08 DIAGNOSIS — E039 Hypothyroidism, unspecified: Secondary | ICD-10-CM | POA: Diagnosis not present

## 2018-05-08 DIAGNOSIS — E782 Mixed hyperlipidemia: Secondary | ICD-10-CM | POA: Diagnosis not present

## 2018-06-22 DIAGNOSIS — E039 Hypothyroidism, unspecified: Secondary | ICD-10-CM | POA: Diagnosis not present

## 2018-07-20 DIAGNOSIS — Z23 Encounter for immunization: Secondary | ICD-10-CM | POA: Diagnosis not present

## 2018-08-28 DIAGNOSIS — H5200 Hypermetropia, unspecified eye: Secondary | ICD-10-CM | POA: Diagnosis not present

## 2018-09-29 DIAGNOSIS — H26491 Other secondary cataract, right eye: Secondary | ICD-10-CM | POA: Diagnosis not present

## 2018-09-29 DIAGNOSIS — H18413 Arcus senilis, bilateral: Secondary | ICD-10-CM | POA: Diagnosis not present

## 2018-09-29 DIAGNOSIS — H35371 Puckering of macula, right eye: Secondary | ICD-10-CM | POA: Diagnosis not present

## 2018-09-29 DIAGNOSIS — H26493 Other secondary cataract, bilateral: Secondary | ICD-10-CM | POA: Diagnosis not present

## 2018-09-29 DIAGNOSIS — Z961 Presence of intraocular lens: Secondary | ICD-10-CM | POA: Diagnosis not present

## 2018-11-13 DIAGNOSIS — K136 Irritative hyperplasia of oral mucosa: Secondary | ICD-10-CM | POA: Diagnosis not present

## 2018-11-13 DIAGNOSIS — T180XXA Foreign body in mouth, initial encounter: Secondary | ICD-10-CM | POA: Diagnosis not present

## 2018-11-16 DIAGNOSIS — E042 Nontoxic multinodular goiter: Secondary | ICD-10-CM | POA: Diagnosis not present

## 2018-11-16 DIAGNOSIS — E782 Mixed hyperlipidemia: Secondary | ICD-10-CM | POA: Diagnosis not present

## 2018-11-16 DIAGNOSIS — L989 Disorder of the skin and subcutaneous tissue, unspecified: Secondary | ICD-10-CM | POA: Diagnosis not present

## 2018-11-16 DIAGNOSIS — I10 Essential (primary) hypertension: Secondary | ICD-10-CM | POA: Diagnosis not present

## 2018-11-16 DIAGNOSIS — E039 Hypothyroidism, unspecified: Secondary | ICD-10-CM | POA: Diagnosis not present

## 2018-11-16 DIAGNOSIS — R1310 Dysphagia, unspecified: Secondary | ICD-10-CM | POA: Diagnosis not present

## 2018-12-13 ENCOUNTER — Emergency Department (HOSPITAL_COMMUNITY): Payer: Medicare HMO

## 2018-12-13 ENCOUNTER — Emergency Department (HOSPITAL_COMMUNITY)
Admission: EM | Admit: 2018-12-13 | Discharge: 2018-12-13 | Disposition: A | Discharge status: Home / Self Care | Payer: Medicare HMO | Attending: Emergency Medicine | Admitting: Emergency Medicine

## 2018-12-13 ENCOUNTER — Other Ambulatory Visit: Payer: Self-pay

## 2018-12-13 ENCOUNTER — Encounter (HOSPITAL_COMMUNITY): Payer: Self-pay | Admitting: Emergency Medicine

## 2018-12-13 DIAGNOSIS — K8020 Calculus of gallbladder without cholecystitis without obstruction: Secondary | ICD-10-CM | POA: Diagnosis not present

## 2018-12-13 DIAGNOSIS — Z79899 Other long term (current) drug therapy: Secondary | ICD-10-CM | POA: Insufficient documentation

## 2018-12-13 DIAGNOSIS — I10 Essential (primary) hypertension: Secondary | ICD-10-CM | POA: Insufficient documentation

## 2018-12-13 DIAGNOSIS — I959 Hypotension, unspecified: Secondary | ICD-10-CM | POA: Diagnosis not present

## 2018-12-13 DIAGNOSIS — R55 Syncope and collapse: Secondary | ICD-10-CM

## 2018-12-13 DIAGNOSIS — Z7982 Long term (current) use of aspirin: Secondary | ICD-10-CM | POA: Insufficient documentation

## 2018-12-13 DIAGNOSIS — E039 Hypothyroidism, unspecified: Secondary | ICD-10-CM | POA: Diagnosis not present

## 2018-12-13 LAB — CBC WITH DIFFERENTIAL/PLATELET
Abs Immature Granulocytes: 0.07 10*3/uL (ref 0.00–0.07)
Basophils Absolute: 0 10*3/uL (ref 0.0–0.1)
Basophils Relative: 0 %
Eosinophils Absolute: 0.1 10*3/uL (ref 0.0–0.5)
Eosinophils Relative: 1 %
HCT: 39.7 % (ref 39.0–52.0)
Hemoglobin: 13.2 g/dL (ref 13.0–17.0)
Immature Granulocytes: 1 %
Lymphocytes Relative: 10 %
Lymphs Abs: 1.4 10*3/uL (ref 0.7–4.0)
MCH: 31.6 pg (ref 26.0–34.0)
MCHC: 33.2 g/dL (ref 30.0–36.0)
MCV: 95 fL (ref 80.0–100.0)
Monocytes Absolute: 1.6 10*3/uL — ABNORMAL HIGH (ref 0.1–1.0)
Monocytes Relative: 12 %
Neutro Abs: 10.2 10*3/uL — ABNORMAL HIGH (ref 1.7–7.7)
Neutrophils Relative %: 76 %
Platelets: 283 10*3/uL (ref 150–400)
RBC: 4.18 MIL/uL — ABNORMAL LOW (ref 4.22–5.81)
RDW: 12.8 % (ref 11.5–15.5)
WBC: 13.4 10*3/uL — ABNORMAL HIGH (ref 4.0–10.5)
nRBC: 0 % (ref 0.0–0.2)

## 2018-12-13 LAB — URINALYSIS, ROUTINE W REFLEX MICROSCOPIC
Bilirubin Urine: NEGATIVE
Glucose, UA: NEGATIVE mg/dL
Hgb urine dipstick: NEGATIVE
Ketones, ur: 20 mg/dL — AB
Leukocytes,Ua: NEGATIVE
Nitrite: NEGATIVE
Protein, ur: NEGATIVE mg/dL
Specific Gravity, Urine: 1.015 (ref 1.005–1.030)
pH: 6 (ref 5.0–8.0)

## 2018-12-13 LAB — BASIC METABOLIC PANEL
Anion gap: 10 (ref 5–15)
BUN: 17 mg/dL (ref 8–23)
CO2: 22 mmol/L (ref 22–32)
Calcium: 8.4 mg/dL — ABNORMAL LOW (ref 8.9–10.3)
Chloride: 100 mmol/L (ref 98–111)
Creatinine, Ser: 1.19 mg/dL (ref 0.61–1.24)
GFR calc Af Amer: >60 mL/min (ref >60)
GFR calc non Af Amer: 59 mL/min — ABNORMAL LOW (ref >60)
Glucose, Bld: 102 mg/dL — ABNORMAL HIGH (ref 70–99)
Potassium: 4.2 mmol/L (ref 3.5–5.1)
Sodium: 132 mmol/L — ABNORMAL LOW (ref 135–145)

## 2018-12-13 MED ORDER — IOPAMIDOL (ISOVUE-370) INJECTION 76%
100.0000 mL | Freq: Once | INTRAVENOUS | Status: AC | PRN
  Administered 2018-12-13: 100 mL via INTRAVENOUS

## 2018-12-13 MED ORDER — SODIUM CHLORIDE 0.9 % IV BOLUS
1000.0000 mL | Freq: Once | INTRAVENOUS | Status: AC
  Administered 2018-12-13: 1000 mL via INTRAVENOUS

## 2018-12-13 NOTE — ED Notes (Signed)
RN informed Pt can receive visitor

## 2018-12-13 NOTE — ED Triage Notes (Addendum)
Per [facility] and Pt- Pt picked up from work, reports he started to feel weak and dizzy.  Relieved after sitting down. Once EMS arrived blood pressure 70 palpated. Pt has hx of hypertension and reports taking medications this morning.  CBG 138.  Give 500 NS PTA. Current pressure 121/59

## 2018-12-13 NOTE — ED Provider Notes (Signed)
[place] EMERGENCY DEPARTMENT Provider Note   CSN: [id number] Arrival date & time: 12/13/18  4696    History   Chief Complaint Chief Complaint  Patient presents with  . Dizziness  . Weakness  . Hypotension    HPI Mark Ewing is a 78 y.o. male.     HPI   [name] with episode of dizziness. Happened shortly before arrival while at work. Wasn't doing anything strenuous. Felt like he may pass out. Sat down with improvement. No acute pain, dyspnea, palpitations. Initial BP 29B systolic. Improved with IVF. No further symptoms since then. Hx of HTN and took meds this morning. Didn't eat.  Past Medical History:  Diagnosis Date  . Carotid artery stenosis    50-70% left  . GERD (gastroesophageal reflux disease)   . Hyperlipidemia   . Hypertension   . Hypothyroidism   . Thyroid disease   . Thyroid nodule     There are no active problems to display for this patient.   Past Surgical History:  Procedure Laterality Date  . HERNIA REPAIR          Home Medications    Prior to Admission medications   Medication Sig Start Date End Date Taking? Authorizing Provider  aspirin 81 MG tablet Take 81 mg by mouth daily.    [provider]  cetirizine (ZYRTEC) 10 MG tablet Take 10 mg by mouth daily.    [provider]  levothyroxine (SYNTHROID, LEVOTHROID) 50 MCG tablet Take 75 mcg by mouth daily before breakfast.     [provider]  lisinopril (PRINIVIL,ZESTRIL) 20 MG tablet Take 20 mg by mouth daily.    [provider]  sildenafil (VIAGRA) 100 MG tablet Take 100 mg by mouth daily as needed for erectile dysfunction.    [provider]  simvastatin (ZOCOR) 10 MG tablet Take 10 mg by mouth daily.    [provider]  traMADol (ULTRAM) 50 MG tablet Take 1 tablet (50 mg total) by mouth every 6 (six) hours as needed. 04/24/15   Fredia Sorrow, MD  verapamil (VERELAN PM) 360 MG 24 hr capsule Take 360 mg by mouth  at bedtime.    [provider]    Family History Family History  Problem Relation Age of Onset  . Hypertension Mother   . Ovarian cancer Mother   . Hypertension Father   . CVA Father   . Prostate cancer Father   . Hypertension Brother   . Hypertension Paternal Grandfather   . CVA Paternal Grandfather     Social History Social History   Tobacco Use  . Smoking status: Never Smoker  . Smokeless tobacco: Never Used  Substance Use Topics  . Alcohol use: No  . Drug use: No     Allergies   Patient has no known allergies.   Review of Systems Review of Systems  All systems reviewed and negative, other than as noted in HPI.  Physical Exam Updated Vital Signs BP (!) 113/55   Pulse 65   Temp 97.9 F (36.6 C) (Oral)   Resp 14   Ht 5\' 9"  (1.753 m)   Wt 81.2 kg   SpO2 98%   BMI 26.43 kg/m   Physical Exam Vitals signs and nursing note reviewed.  Constitutional:      General: He is not in acute distress.    Appearance: He is well-developed.  HENT:     Head: Normocephalic and atraumatic.  Eyes:     General:  Right eye: No discharge.        Left eye: No discharge.     Conjunctiva/sclera: Conjunctivae normal.  Neck:     Musculoskeletal: Neck supple.  Cardiovascular:     Rate and Rhythm: Normal rate and regular rhythm.     Heart sounds: Normal heart sounds. No murmur. No friction rub. No gallop.   Pulmonary:     Effort: Pulmonary effort is normal. No respiratory distress.     Breath sounds: Normal breath sounds.  Abdominal:     General: There is no distension.     Palpations: Abdomen is soft.     Tenderness: There is no abdominal tenderness.  Musculoskeletal:        General: No tenderness.  Skin:    General: Skin is warm and dry.  Neurological:     Mental Status: He is alert and oriented to person, place, and time. Mental status is at baseline.     Cranial Nerves: No cranial nerve deficit.     Sensory: No sensory deficit.     Motor: No  weakness.     Coordination: Coordination normal.  Psychiatric:        Behavior: Behavior normal.        Thought Content: Thought content normal.      ED Treatments / Results  Labs (all labs ordered are listed, but only abnormal results are displayed) Labs Reviewed  CBC WITH DIFFERENTIAL/PLATELET - Abnormal; Notable for the following components:      Result Value   WBC 13.4 (*)    RBC 4.18 (*)    Neutro Abs 10.2 (*)    Monocytes Absolute 1.6 (*)    All other components within normal limits  BASIC METABOLIC PANEL - Abnormal; Notable for the following components:   Sodium 132 (*)    Glucose, Bld 102 (*)    Calcium 8.4 (*)    GFR calc non Af Amer 59 (*)    All other components within normal limits  URINALYSIS, ROUTINE W REFLEX MICROSCOPIC - Abnormal; Notable for the following components:   Ketones, ur 20 (*)    All other components within normal limits    EKG EKG Interpretation  Date/Time:  Wednesday December 13 2018 10:08:29 EST Ventricular Rate:  66 PR Interval:    QRS Duration: 98 QT Interval:  444 QTC Calculation: 466 R Axis:   36 Text Interpretation:  Sinus rhythm Low voltage, precordial leads Abnormal R-wave progression, early transition agree, no STEMI, no sig change from previous Confirmed by Charlesetta Shanks [phone]) on 12/14/2018 9:59:08 AM   Radiology No results found.  Procedures Procedures (including critical care time)  Medications Ordered in ED Medications - No data to display   Initial Impression / Assessment and Plan / ED Course  I have reviewed the triage vital signs and the nursing notes.  Pertinent labs & imaging results that were available during my care of the patient were reviewed by me and considered in my medical decision making (see chart for details).    [name] with what sounds like near syncope. Symptoms now resolved and HD stable. ED w/u fairly unremarkable as to explanation. Probably from hypotension. This was transient though. Has  mild leukocytosis but I do not think this is sepsis. He is not anemic. Renal function ok. I doubt emergent cause.   Final Clinical Impressions(s) / ED Diagnoses   Final diagnoses:  Near syncope    ED Discharge Orders    None  Virgel Manifold, MD 12/20/18 0900

## 2018-12-13 NOTE — ED Notes (Signed)
Pt transported to CT ?

## 2018-12-15 DIAGNOSIS — D72829 Elevated white blood cell count, unspecified: Secondary | ICD-10-CM | POA: Diagnosis not present

## 2018-12-15 DIAGNOSIS — K219 Gastro-esophageal reflux disease without esophagitis: Secondary | ICD-10-CM | POA: Diagnosis not present

## 2018-12-15 DIAGNOSIS — R55 Syncope and collapse: Secondary | ICD-10-CM | POA: Diagnosis not present

## 2018-12-15 DIAGNOSIS — I10 Essential (primary) hypertension: Secondary | ICD-10-CM | POA: Diagnosis not present

## 2018-12-20 DIAGNOSIS — C490 Malignant neoplasm of connective and soft tissue of head, face and neck: Secondary | ICD-10-CM | POA: Diagnosis not present

## 2018-12-20 DIAGNOSIS — C4442 Squamous cell carcinoma of skin of scalp and neck: Secondary | ICD-10-CM | POA: Diagnosis not present

## 2019-02-01 ENCOUNTER — Other Ambulatory Visit: Payer: Self-pay | Admitting: Oncology

## 2019-02-01 DIAGNOSIS — L989 Disorder of the skin and subcutaneous tissue, unspecified: Secondary | ICD-10-CM | POA: Diagnosis not present

## 2019-02-01 DIAGNOSIS — C490 Malignant neoplasm of connective and soft tissue of head, face and neck: Secondary | ICD-10-CM | POA: Diagnosis not present

## 2019-02-01 DIAGNOSIS — R238 Other skin changes: Secondary | ICD-10-CM | POA: Diagnosis not present

## 2019-04-04 DIAGNOSIS — R1310 Dysphagia, unspecified: Secondary | ICD-10-CM | POA: Diagnosis not present

## 2019-04-06 ENCOUNTER — Other Ambulatory Visit: Payer: Self-pay | Admitting: Gastroenterology

## 2019-04-06 DIAGNOSIS — R1310 Dysphagia, unspecified: Secondary | ICD-10-CM

## 2019-04-10 ENCOUNTER — Ambulatory Visit
Admission: RE | Admit: 2019-04-10 | Discharge: 2019-04-10 | Disposition: A | Discharge status: Home / Self Care | Payer: Medicare HMO | Source: Ambulatory Visit | Attending: Gastroenterology | Admitting: Gastroenterology

## 2019-04-10 ENCOUNTER — Other Ambulatory Visit: Payer: Self-pay

## 2019-04-10 DIAGNOSIS — R1310 Dysphagia, unspecified: Secondary | ICD-10-CM | POA: Diagnosis not present

## 2019-04-12 ENCOUNTER — Other Ambulatory Visit: Payer: Self-pay | Admitting: Gastroenterology

## 2019-04-12 DIAGNOSIS — R1013 Epigastric pain: Secondary | ICD-10-CM

## 2019-04-18 DIAGNOSIS — K2930 Chronic superficial gastritis without bleeding: Secondary | ICD-10-CM | POA: Diagnosis not present

## 2019-04-18 DIAGNOSIS — R1013 Epigastric pain: Secondary | ICD-10-CM | POA: Diagnosis not present

## 2019-04-18 DIAGNOSIS — K3189 Other diseases of stomach and duodenum: Secondary | ICD-10-CM | POA: Diagnosis not present

## 2019-04-18 DIAGNOSIS — R1310 Dysphagia, unspecified: Secondary | ICD-10-CM | POA: Diagnosis not present

## 2019-04-25 ENCOUNTER — Other Ambulatory Visit: Payer: Self-pay

## 2019-04-25 ENCOUNTER — Ambulatory Visit
Admission: RE | Admit: 2019-04-25 | Discharge: 2019-04-25 | Disposition: A | Discharge status: Home / Self Care | Payer: Medicare HMO | Source: Ambulatory Visit | Attending: Gastroenterology | Admitting: Gastroenterology

## 2019-04-25 DIAGNOSIS — N281 Cyst of kidney, acquired: Secondary | ICD-10-CM | POA: Diagnosis not present

## 2019-04-25 DIAGNOSIS — K2930 Chronic superficial gastritis without bleeding: Secondary | ICD-10-CM | POA: Diagnosis not present

## 2019-04-25 DIAGNOSIS — R1013 Epigastric pain: Secondary | ICD-10-CM

## 2019-04-25 DIAGNOSIS — K8020 Calculus of gallbladder without cholecystitis without obstruction: Secondary | ICD-10-CM | POA: Diagnosis not present

## 2019-04-27 ENCOUNTER — Ambulatory Visit: Payer: Self-pay | Admitting: Surgery

## 2019-04-27 DIAGNOSIS — K8010 Calculus of gallbladder with chronic cholecystitis without obstruction: Secondary | ICD-10-CM | POA: Diagnosis not present

## 2019-04-27 DIAGNOSIS — I700 Atherosclerosis of aorta: Secondary | ICD-10-CM | POA: Diagnosis not present

## 2019-04-27 NOTE — H&P (Signed)
History of Present Illness Mark Ewing. Mark Setterlund MD; 04/27/2019 2:11 PM) The patient is a 78 year old male who presents for evaluation of gall stones. Referred by Dr. Penelope Coop for gallstones PCP - Dr. Antony Contras  This is a 78 year old male who presents with a 7 month history of intermittent epigastric abdominal pain that radiates through to his back. Usually this is postprandial. This is associated with some nausea but he denies any vomiting or diarrhea. He has been afebrile. He has been evaluated once in the emergency department with a CT angiogram of the abdomen and pelvis. This showed no sign of mesenteric ischemia but did show cholelithiasis. He also underwent an upper GI which showed no significant reflux or esophageal stricture. Ultrasound revealed cholelithiasis with no sign of cholecystitis. He continues to have intermittent postprandial epigastric abdominal pain that radiates through to his back. He is now referred to [place] to discuss elective cholecystectomy. No previous abdominal surgery.  CLINICAL DATA: Assess for mesenteric ischemia. Dizziness today.  EXAM: CTA ABDOMEN AND PELVIS wITHOUT AND WITH CONTRAST  TECHNIQUE: Multidetector CT imaging of the abdomen and pelvis was performed using the standard protocol during bolus administration of intravenous contrast. Multiplanar reconstructed images and MIPs were obtained and reviewed to evaluate the vascular anatomy.  CONTRAST: 19mL ISOVUE-370 IOPAMIDOL (ISOVUE-370) INJECTION 76%  COMPARISON: 11/23/2015  FINDINGS: VASCULAR  Aorta: Nonaneurysmal and patent with atherosclerotic calcifications.  Celiac: Patent. Branch vessels patent.  SMA: Patent. Branch vessels are grossly patent.  Renals: 2 right and 2 left renal arteries are patent.  IMA: Patent. Branch vessels are grossly patent.  Inflow: Bilateral common, internal, and external iliac arteries are patent with scattered atherosclerotic calcifications.  Proximal  Outflow: Grossly patent.  Veins: No evidence of DVT.  Review of the MIP images confirms the above findings.  NON-VASCULAR  Lower chest: Dependent atelectasis at the right lung base.  Hepatobiliary: Gallstones are noted. Liver is within normal limits in appearance.  Pancreas: Unremarkable  Spleen: Unremarkable  Adrenals/Urinary Tract: Several pelvic cysts are noted. Left adrenal nodule is stable supporting benign etiology. Unremarkable bladder.  Stomach/Bowel: Normal appendix. No obvious mass in the colon. Mildly distended small bowel loops in the left hemiabdomen are noted without transition point. There is subtle small bowel wall thickening in the left hemiabdomen associated with stranding in the mesenteric fat. See image 43. There is no free intraperitoneal gas or abscess formation.  Lymphatic: There is no abnormal retroperitoneal adenopathy.  Reproductive: Normal prostate.  Other: No free fluid or free intraperitoneal gas.  Musculoskeletal: No vertebral compression deformity.  IMPRESSION: VASCULAR  No significant narrowing involving the visceral vasculature to result in mesenteric ischemia.  NON-VASCULAR  There is small bowel wall thickening in the left hemiabdomen associated with stranding in the adjacent fat. Findings are most consistent with an inflammatory process. There is associated mild small-bowel distention which may be related to a focal ileus. There is no free intraperitoneal gas or abscess.  Cholelithiasis.   Electronically Signed By: Marybelle Killings M.D. On: 12/13/2018 16:14  CLINICAL DATA: Chronic dysphagia with cough and fullness in the throat.  EXAM: ESOPHOGRAM / BARIUM SWALLOW / BARIUM TABLET STUDY  TECHNIQUE: Combined double contrast and single contrast examination performed using effervescent crystals, thick barium liquid, and thin barium liquid. The patient was observed with fluoroscopy swallowing a 13 mm barium sulphate  tablet.  FLUOROSCOPY TIME: Fluoroscopy Time: 2 minutes 42 seconds  Radiation Exposure Index (if provided by the fluoroscopic device): 127 mGy  Number of Acquired Spot Images:  6  COMPARISON: None.  FINDINGS: The oral and pharyngeal phases of swallowing are normal, with no laryngeal penetration or tracheobronchial aspiration. No significant barium retention in the pharynx. No evidence of pharyngeal mass, stricture or diverticulum. No significant cricopharyngeus muscle dysfunction.  Mild esophageal dysmotility with mild proximal escape of the barium bolus in the upper thoracic esophagus and with mild tertiary non peristaltic contractions. No hiatal hernia. No gastroesophageal reflux elicited, despite provocative maneuvers including water siphon test. Normal esophageal distensibility, with no evidence of esophageal mass, stricture or ulcer. Normal esophageal mucosa. Barium tablet traversed the esophagus into the stomach without delay.  IMPRESSION: 1. Normal oral and pharyngeal phases of swallowing. 2. No hiatal hernia. No gastroesophageal reflux elicited. 3. Mild esophageal dysmotility, with presbyesophagus pattern. 4. No evidence of reflux esophagitis. No esophageal mass, ulcer or stricture detected.   Electronically Signed By: Ilona Sorrel M.D. On: 04/10/2019 11:37  CLINICAL DATA: Epigastric pain for 1 year.  EXAM: ABDOMEN ULTRASOUND COMPLETE  COMPARISON: None.  FINDINGS: Gallbladder: No wall thickening visualized. Gallstones are identified. No sonographic Murphy sign noted by sonographer.  Common bile duct: Diameter: 2.7 mm  Liver: No focal lesion identified. Coarsened heterogeneous echotexture of the liver is identified. Portal vein is patent on color Doppler imaging with normal direction of blood flow towards the liver.  IVC: No abnormality visualized.  Pancreas: Visualized portion unremarkable.  Spleen: Size and appearance within normal  limits.  Right Kidney: Length: 10.2 cm. Echogenicity within normal limits. No hydronephrosis visualized. There is a 2.1 x 1.6 x 1 6 cm cyst.  Left Kidney: Length: 10.9 cm. Echogenicity within normal limits. No hydronephrosis visualized. There are cysts in the left kidney, largest measures 2.2 x 2.1 x 1.9 cm  Abdominal aorta: No aneurysm visualized.  Other findings: None.  IMPRESSION: Cholelithiasis without sonographic evidence of acute cholecystitis.  Coarsened heterogeneous echotexture of the liver. This is nonspecific but can be seen in fatty infiltration of liver.  Bilateral kidney cysts.   Electronically Signed By: Abelardo Diesel M.D. On: 04/25/2019 13:10   Problem List/Past Medical Mark Key K. Keyaria Lawson, MD; 04/27/2019 2:11 PM) CHRONIC CHOLECYSTITIS WITH CALCULUS (K80.10)  Past Surgical History (Tanisha A. Owens Shark, [place]; 04/27/2019 9:37 AM) Laparoscopic Inguinal Hernia Surgery Bilateral. Tonsillectomy  Allergies (Tanisha A. Owens Shark, [place]; 04/27/2019 9:38 AM) No Known Drug Allergies [04/27/2019]: Allergies Reconciled  Medication History (Tanisha A. Owens Shark, [place]; 04/27/2019 9:38 AM) Simvastatin (10MG  Tablet, Oral) Active. Losartan Potassium (50MG  Tablet, Oral) Active. Aspirin (81MG  Tablet, Oral) Active. Levothyroxine Sodium (88MCG Tablet, Oral) Active. Verapamil HCl (180MG  Tablet ER, Oral) Active. Medications Reconciled  Social History (Tanisha A. Owens Shark, [place]; 04/27/2019 9:37 AM) Caffeine use Carbonated beverages, Tea. No alcohol use No drug use Tobacco use Never smoker.  Family History (Tanisha A. Owens Shark, [place]; 04/27/2019 9:37 AM) Arthritis Father, Mother. Cerebrovascular Accident Father. Hypertension Brother, Father, Mother. Kidney Disease Brother. Ovarian Cancer Mother. Rectal Cancer Father.  Other Problems Mark Ewing. Yehya Brendle, MD; 04/27/2019 2:11 PM) Back Pain Cholelithiasis Gastroesophageal Reflux Disease High blood  pressure Hypercholesterolemia Thyroid Disease     Review of Systems (Tanisha A. Brown RMA; 04/27/2019 9:37 AM) General Not Present- Appetite Loss, Chills, Fatigue, Fever, Night Sweats, Weight Gain and Weight Loss. Skin Not Present- Change in Wart/Mole, Dryness, Hives, Jaundice, New Lesions, Non-Healing Wounds, Rash and Ulcer. HEENT Not Present- Earache, Hearing Loss, Hoarseness, Nose Bleed, Oral Ulcers, Ringing in the Ears, Seasonal Allergies, Sinus Pain, Sore Throat, Visual Disturbances, Wears glasses/contact lenses and Yellow Eyes. Respiratory Not Present- Bloody sputum, Chronic Cough, Difficulty  Breathing, Snoring and Wheezing. Breast Not Present- Breast Mass, Breast Pain, Nipple Discharge and Skin Changes. Cardiovascular Not Present- Chest Pain, Difficulty Breathing Lying Down, Leg Cramps, Palpitations, Rapid Heart Rate, Shortness of Breath and Swelling of Extremities. Gastrointestinal Present- Abdominal Pain and Bloating. Not Present- Bloody Stool, Change in Bowel Habits, Chronic diarrhea, Constipation, Difficulty Swallowing, Excessive gas, Gets full quickly at meals, Hemorrhoids, Indigestion, Nausea, Rectal Pain and Vomiting. Male Genitourinary Not Present- Blood in Urine, Change in Urinary Stream, Frequency, Impotence, Nocturia, Painful Urination, Urgency and Urine Leakage. Musculoskeletal Present- Back Pain. Not Present- Joint Pain, Joint Stiffness, Muscle Pain, Muscle Weakness and Swelling of Extremities. Neurological Not Present- Decreased Memory, Fainting, Headaches, Numbness, Seizures, Tingling, Tremor, Trouble walking and Weakness. Psychiatric Not Present- Anxiety, Bipolar, Change in Sleep Pattern, Depression, Fearful and Frequent crying. Endocrine Not Present- Cold Intolerance, Excessive Hunger, Hair Changes, Heat Intolerance, Hot flashes and New Diabetes. Hematology Not Present- Blood Thinners, Easy Bruising, Excessive bleeding, Gland problems, HIV and Persistent  Infections.  Vitals (Tanisha A. Brown RMA; 04/27/2019 9:37 AM) 04/27/2019 9:37 AM Weight: 177.8 lb Height: 69in Body Surface Area: 1.96 m Body Mass Index: 26.26 kg/m  Temp.: 98.39F  Pulse: 85 (Regular)  BP: 132/86 (Sitting, Left Arm, Standard)        Physical Exam Mark Key K. Jaide Hillenburg MD; 04/27/2019 2:11 PM)  The physical exam findings are as follows: Note:WDWN in NAD Eyes: Pupils equal, round; sclera anicteric HENT: Oral mucosa moist; good dentition Neck: No masses palpated, no thyromegaly Lungs: CTA bilaterally; normal respiratory effort CV: Regular rate and rhythm; no murmurs; extremities well-perfused with no edema Abd: +bowel sounds, soft, non-tender, no palpable organomegaly; no palpable hernias Skin: Warm, dry; no sign of jaundice Psychiatric - alert and oriented x 4; calm mood and affect    Assessment & Plan Mark Key K. Kenyette Gundy MD; 04/27/2019 10:02 AM)  CHRONIC CHOLECYSTITIS WITH CALCULUS (K80.10)  Current Plans Schedule for Surgery - Laparoscopic cholecystectomy with intraoperative cholangiogram. The surgical procedure has been discussed with the patient. Potential risks, benefits, alternative treatments, and expected outcomes have been explained. All of the patient's questions at this time have been answered. The likelihood of reaching the patient's treatment goal is good. The patient understand the proposed surgical procedure and wishes to proceed.  Mark Ewing. Georgette Dover, MD, [facility] [phone]  04/27/2019 2:12 PM

## 2019-05-29 NOTE — Progress Notes (Signed)
[place], [place] [address] Phone: [phone] Fax: [phone]    Your procedure is scheduled on Wednesday, August 19th.  Report to [facility] Main Entrance "A" at 9:30 A.M., and check in at the Admitting office.  Call this number if you have problems the morning of surgery:  [phone]  Call [phone] if you have any questions prior to your surgery date Monday-Friday 8am-4pm   Remember:  Do not eat after midnight the night before your surgery  You may drink clear liquids until 8:30the morning of your surgery.   Clear liquids allowed are: Water, Non-Citrus Juices (without pulp), Carbonated Beverages, Clear Tea, Black Coffee Only, and Gatorade    Take these medicines the morning of surgery with A SIP OF WATER famotidine (PEPCID) levothyroxine (SYNTHROID, LEVOTHROID)  simvastatin (ZOCOR) verapamil (CALAN-SR)   If needed - acetaminophen (TYLENOL),   7 days prior to surgery STOP taking any Aspirin (unless otherwise instructed by your surgeon), Aleve, Naproxen, Ibuprofen, Motrin, Advil, Goody's, BC's, all herbal medications, fish oil, and all vitamins.  The Morning of Surgery  Do not wear jewelry, make-up or nail polish.  Do not wear lotions, powders, or perfumes/colognes, or deodorant  Do not shave 48 hours prior to surgery.  Men may shave face and neck.  Do not bring valuables to the hospital.  [facility] is not responsible for any belongings or valuables.  If you are a smoker, DO NOT Smoke 24 hours prior to surgery IF you wear a CPAP at night please bring your mask, tubing, and machine the morning of surgery   Remember that you must have someone to transport you home after your surgery, and remain with you for 24 hours if you are discharged the same day.  Contacts, glasses, hearing aids, dentures or bridgework may not be worn into surgery.   Leave your suitcase in the car.  After surgery it  may be brought to your room.  For patients admitted to the hospital, discharge time will be determined by your treatment team.  Patients discharged the day of surgery will not be allowed to drive home.   Special instructions:   [facility]- Preparing For Surgery  Before surgery, you can play an important role. Because skin is not sterile, your skin needs to be as free of germs as possible. You can reduce the number of germs on your skin by washing with CHG (chlorahexidine gluconate) Soap before surgery.  CHG is an antiseptic cleaner which kills germs and bonds with the skin to continue killing germs even after washing.    Oral Hygiene is also important to reduce your risk of infection.  Remember - BRUSH YOUR TEETH THE MORNING OF SURGERY WITH YOUR REGULAR TOOTHPASTE  Please do not use if you have an allergy to CHG or antibacterial soaps. If your skin becomes reddened/irritated stop using the CHG.  Do not shave (including legs and underarms) for at least 48 hours prior to first CHG shower. It is OK to shave your face.  Please follow these instructions carefully.   1. Shower the NIGHT BEFORE SURGERY and the MORNING OF SURGERY with CHG Soap.   2. If you chose to wash your hair, wash your hair first as usual with your normal shampoo.  3. After you shampoo, rinse your hair and body thoroughly to remove the shampoo.  4. Use CHG as you would any other liquid soap. You can apply CHG directly  to the skin and wash gently with a scrungie or a clean washcloth.   5. Apply the CHG Soap to your body ONLY FROM THE NECK DOWN.  Do not use on open wounds or open sores. Avoid contact with your eyes, ears, mouth and genitals (private parts). Wash Face and genitals (private parts)  with your normal soap.   6. Wash thoroughly, paying special attention to the area where your surgery will be performed.  7. Thoroughly rinse your body with warm water from the neck down.  8. DO NOT shower/wash with your normal  soap after using and rinsing off the CHG Soap.  9. Pat yourself dry with a CLEAN TOWEL.  10. Wear CLEAN PAJAMAS to bed the night before surgery, wear comfortable clothes the morning of surgery  11. Place CLEAN SHEETS on your bed the night of your first shower and DO NOT SLEEP WITH PETS.  Day of Surgery:  Do not apply any deodorants/lotions. Please shower the morning of surgery with the CHG soap  Please wear clean clothes to the hospital/surgery center.   Remember to brush your teeth WITH YOUR REGULAR TOOTHPASTE.  Please read over the following fact sheets that you were given.

## 2019-05-30 ENCOUNTER — Encounter (HOSPITAL_COMMUNITY): Payer: Self-pay

## 2019-05-30 ENCOUNTER — Other Ambulatory Visit: Payer: Self-pay

## 2019-05-30 ENCOUNTER — Encounter (HOSPITAL_COMMUNITY)
Admission: RE | Admit: 2019-05-30 | Discharge: 2019-05-30 | Disposition: A | Discharge status: Home / Self Care | Payer: Medicare HMO | Source: Ambulatory Visit | Attending: Surgery | Admitting: Surgery

## 2019-05-30 DIAGNOSIS — K8010 Calculus of gallbladder with chronic cholecystitis without obstruction: Secondary | ICD-10-CM | POA: Insufficient documentation

## 2019-05-30 DIAGNOSIS — E039 Hypothyroidism, unspecified: Secondary | ICD-10-CM | POA: Insufficient documentation

## 2019-05-30 DIAGNOSIS — Z20828 Contact with and (suspected) exposure to other viral communicable diseases: Secondary | ICD-10-CM | POA: Diagnosis not present

## 2019-05-30 DIAGNOSIS — Z01818 Encounter for other preprocedural examination: Secondary | ICD-10-CM | POA: Diagnosis not present

## 2019-05-30 DIAGNOSIS — E785 Hyperlipidemia, unspecified: Secondary | ICD-10-CM | POA: Insufficient documentation

## 2019-05-30 DIAGNOSIS — K219 Gastro-esophageal reflux disease without esophagitis: Secondary | ICD-10-CM | POA: Insufficient documentation

## 2019-05-30 DIAGNOSIS — Z79890 Hormone replacement therapy (postmenopausal): Secondary | ICD-10-CM | POA: Diagnosis not present

## 2019-05-30 DIAGNOSIS — Z9842 Cataract extraction status, left eye: Secondary | ICD-10-CM | POA: Diagnosis not present

## 2019-05-30 DIAGNOSIS — Z7982 Long term (current) use of aspirin: Secondary | ICD-10-CM | POA: Diagnosis not present

## 2019-05-30 DIAGNOSIS — Z961 Presence of intraocular lens: Secondary | ICD-10-CM | POA: Diagnosis not present

## 2019-05-30 DIAGNOSIS — Z9841 Cataract extraction status, right eye: Secondary | ICD-10-CM | POA: Diagnosis not present

## 2019-05-30 DIAGNOSIS — Z79899 Other long term (current) drug therapy: Secondary | ICD-10-CM | POA: Diagnosis not present

## 2019-05-30 DIAGNOSIS — I10 Essential (primary) hypertension: Secondary | ICD-10-CM | POA: Diagnosis not present

## 2019-05-30 HISTORY — DX: Malignant (primary) neoplasm, unspecified: C80.1

## 2019-05-30 HISTORY — DX: Unilateral inguinal hernia, without obstruction or gangrene, not specified as recurrent: K40.90

## 2019-05-30 LAB — BASIC METABOLIC PANEL
Anion gap: 11 (ref 5–15)
BUN: 12 mg/dL (ref 8–23)
CO2: 22 mmol/L (ref 22–32)
Calcium: 8.8 mg/dL — ABNORMAL LOW (ref 8.9–10.3)
Chloride: 96 mmol/L — ABNORMAL LOW (ref 98–111)
Creatinine, Ser: 1.05 mg/dL (ref 0.61–1.24)
GFR calc Af Amer: >60 mL/min (ref >60)
GFR calc non Af Amer: >60 mL/min (ref >60)
Glucose, Bld: 96 mg/dL (ref 70–99)
Potassium: 4.3 mmol/L (ref 3.5–5.1)
Sodium: 129 mmol/L — ABNORMAL LOW (ref 135–145)

## 2019-05-30 LAB — CBC
HCT: 37.1 % — ABNORMAL LOW (ref 39.0–52.0)
Hemoglobin: 12.5 g/dL — ABNORMAL LOW (ref 13.0–17.0)
MCH: 30.7 pg (ref 26.0–34.0)
MCHC: 33.7 g/dL (ref 30.0–36.0)
MCV: 91.2 fL (ref 80.0–100.0)
Platelets: 315 10*3/uL (ref 150–400)
RBC: 4.07 MIL/uL — ABNORMAL LOW (ref 4.22–5.81)
RDW: 12.9 % (ref 11.5–15.5)
WBC: 5.6 10*3/uL (ref 4.0–10.5)
nRBC: 0 % (ref 0.0–0.2)

## 2019-05-30 NOTE — Progress Notes (Signed)
Anesthesia Chart Review:  Case: [id number] Date/Time: 06/06/19 1115   Procedure: LAPAROSCOPIC CHOLECYSTECTOMY WITH INTRAOPERATIVE CHOLANGIOGRAM (N/A )   Anesthesia type: General   Pre-op diagnosis: CHRONIC CALCULUS CHOLECYSTITIS   Location: [facility] OR ROOM 02 / [facility]   Surgeon: Donnie Mesa, MD      DISCUSSION: Patient is a 78 year old man scheduled for the above procedure.  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 No significant ICA stenosis by 04/2012 CTA.  Patient to hold ASA 5 days prior to surgery.   Preoperative labs show Na 129, Cl 96, Cr 1.05, H/H 12.5/37.1, glucose 96. Lab trends suggest chronic hyponatremia (previously 132-134 in 11/2018, 11/2015, 11/2010). Will order STAT labs (ISTAT4 if available, or equivalent) to recheck Na on the day of surgery. If result is stable or improved and otherwise no acute changes then I would anticipate that he can proceed as planned. Presurgical COVID test is scheduled for 06/02/19.      VS: BP (!) 154/62   Pulse 84   Temp (!) 36.4 C   Resp 20   Ht 5\' 9"  (1.753 m)   Wt 78.9 kg   SpO2 100%   BMI 25.70 kg/m    PROVIDERS: Antony Contras, MD is PCP Acquanetta Sit, MD is GI - He is not followed routinely by a cardiologist, but saw Fransico Him, MD in 2013 following syncope. Stress/echo outlined below. She felt his syncope was related to extreme heat and dehydration.   LABS: Preoperative labs noted. PAT RN already sent message to Dr. Georgette Dover regarding abnormal labs. Will order ISTAT4 or equivalent on the day of surgery to recheck hyponatremia.  (all labs ordered are listed, but only abnormal results are displayed)  Labs Reviewed  BASIC METABOLIC PANEL - Abnormal; Notable for the following components:      Result Value   Sodium 129 (*)    Chloride 96 (*)    Calcium 8.8 (*)    All other components within normal limits  CBC - Abnormal;  Notable for the following components:   RBC 4.07 (*)    Hemoglobin 12.5 (*)    HCT 37.1 (*)    All other components within normal limits     IMAGES: CTA abd/pelvis 12/13/18: IMPRESSION: VASCULAR - No significant narrowing involving the visceral vasculature to result in mesenteric ischemia. NON-VASCULAR - There is small bowel wall thickening in the left hemiabdomen associated with stranding in the adjacent fat. Findings are most consistent with an inflammatory process. There is associated mild small-bowel distention which may be related to a focal ileus. There is no free intraperitoneal gas or abscess. - Cholelithiasis.   EKG: 12/13/18: SR. Low voltage, precordial leads. Abnormal R-wave progression, early transition. No significant change when compared to previous tracing.   CV: Echo 05/04/12 (former [facility]): Conclusions: 1.  Left ventricular ejection fraction estimated by 2D at 61.3%. 2.  Borderline left atrial enlargement. 3.  Interatrial septum is aneurysmal. 4.  Mild mitral valve regurgitation. 5.  The aortic valve is sclerotic but opens well. 6.  Trivial tricuspid regurgitation. 7.  Trace pulmonic valve regurgitation. 8.  Analysis of mitral valve inflow, pulmonary vein Doppler and tissue Doppler suggests grade 1 diastolic dysfunction without elevated left atrial pressure.  Nuclear stress test 05/04/12 (former [facility]): Impression:  Normal myocardial perfusion study.  Normal myocardial perfusion scan demonstrating an attenuation artifact in the inferior region of the myocardium.  No ischemia or infarct/scar seen in  the remaining myocardium.  Post-rest EF 55%.  Exercise capacity of 10.0 METS.  CTA neck 05/02/12 (CTA was recommended to further evaluate LICA, given discrepancies on 03/30/12 [place] that did not show hemodynamically significant stenosis, but velocities correlated with 50-69% stenosis): IMPRESSION: - Mild atherosclerotic plaque in the carotid bulb on  the left without significant stenosis. - No significant right carotid stenosis. - Calcified plaque and mild stenosis at the origin of the left vertebral artery. - 8 mm thyroid nodule on the right.   Past Medical History:  Diagnosis Date  . Cancer ([place])    per patient "malignant spindle cell neoplasm on top of scalp"  . Carotid artery stenosis    1/61/09 [place]: 60-45% LICA stenosis by velocities, CTA neck rec that showed no sign LICA stenosis  . GERD (gastroesophageal reflux disease)   . Hyperlipidemia   . Hypertension   . Hypothyroidism   . Inguinal hernia   . Thyroid disease   . Thyroid nodule     Past Surgical History:  Procedure Laterality Date  . CATARACT EXTRACTION W/ INTRAOCULAR LENS  IMPLANT, BILATERAL    . HERNIA REPAIR    . TONSILLECTOMY      MEDICATIONS: . acetaminophen (TYLENOL) 500 MG tablet  . aspirin 81 MG tablet  . co-enzyme Q-10 50 MG capsule  . famotidine (PEPCID) 20 MG tablet  . ibuprofen (ADVIL) 200 MG tablet  . levothyroxine (SYNTHROID, LEVOTHROID) 88 MCG tablet  . losartan (COZAAR) 50 MG tablet  . simvastatin (ZOCOR) 10 MG tablet  . Turmeric 500 MG CAPS  . verapamil (CALAN-SR) 180 MG CR tablet   No current facility-administered medications for this encounter.      Myra Gianotti, PA-C Surgical Short Stay/Anesthesiology [facility] Phone [phone] [facility] Phone [phone] 05/31/2019 1:33 PM

## 2019-05-30 NOTE — Progress Notes (Signed)
[facility] Dr. Georgette Dover about patient's abnormal labs.

## 2019-05-30 NOTE — Progress Notes (Addendum)
PCP - Dr. Antony Contras Cardiologist - denies  Chest x-ray - denies EKG - 12/14/2018 Stress Test - per patient "about five years ago, no follow-up required"  ECHO - per patient "about five years ago, no follow-up required" Cardiac Cath - denies  Sleep Study - denies CPAP - N/A  Blood Thinner Instructions: N/A Aspirin Instructions: per patient "5 days prior to surgery" LD will be 06/01/2019  Anesthesia review: YES, hx of carotid artery stenosis  Coronavirus Screening  Have you experienced the following symptoms:  Cough yes/no: No Fever (>100.51F)  yes/no: No Runny nose yes/no: No Sore throat yes/no: No Difficulty breathing/shortness of breath  yes/no: No  Have you or a family member traveled in the last 14 days and where? yes/no: No  If the patient indicates "YES" to the above questions, their PAT will be rescheduled to limit the exposure to others and, the surgeon will be notified. THE PATIENT WILL NEED TO BE ASYMPTOMATIC FOR 14 DAYS.   If the patient is not experiencing any of these symptoms, the PAT nurse will instruct them to NOT bring anyone with them to their appointment since they may have these symptoms or traveled as well.   Please remind your patients and families that hospital visitation restrictions are in effect and the importance of the restrictions.   Patient denies shortness of breath, fever, cough and chest pain at PAT appointment  Patient verbalized understanding of instructions that were given to them at the PAT appointment. Patient was also instructed that they will need to review over the PAT instructions again at home before surgery.

## 2019-05-31 NOTE — Anesthesia Preprocedure Evaluation (Addendum)
Anesthesia Evaluation  Patient identified by MRN, date of birth, ID band Patient awake    Reviewed: Allergy & Precautions, NPO status , Patient's Chart, lab work & pertinent test results  Airway Mallampati: I  TM Distance: >3 FB Neck ROM: Full    Dental   Pulmonary    Pulmonary exam normal        Cardiovascular hypertension, Pt. on medications Normal cardiovascular exam     Neuro/Psych    GI/Hepatic GERD  Medicated and Controlled,  Endo/Other    Renal/GU      Musculoskeletal   Abdominal   Peds  Hematology   Anesthesia Other Findings   Reproductive/Obstetrics                            Anesthesia Physical Anesthesia Plan  ASA: II  Anesthesia Plan: General   Post-op Pain Management:    Induction: Intravenous  PONV Risk Score and Plan: 2 and Ondansetron and Treatment may vary due to age or medical condition  Airway Management Planned: Oral ETT  Additional Equipment:   Intra-op Plan:   Post-operative Plan: Extubation in OR  Informed Consent: I have reviewed the patients History and Physical, chart, labs and discussed the procedure including the risks, benefits and alternatives for the proposed anesthesia with the patient or authorized representative who has indicated his/her understanding and acceptance.       Plan Discussed with: CRNA and Surgeon  Anesthesia Plan Comments: (PAT note written by Myra Gianotti, PA-C. )       Anesthesia Quick Evaluation

## 2019-06-02 ENCOUNTER — Other Ambulatory Visit (HOSPITAL_COMMUNITY)
Admission: RE | Admit: 2019-06-02 | Discharge: 2019-06-02 | Disposition: A | Discharge status: Home / Self Care | Payer: Medicare HMO | Source: Ambulatory Visit | Attending: Surgery | Admitting: Surgery

## 2019-06-02 DIAGNOSIS — Z01818 Encounter for other preprocedural examination: Secondary | ICD-10-CM | POA: Diagnosis not present

## 2019-06-02 DIAGNOSIS — Z79899 Other long term (current) drug therapy: Secondary | ICD-10-CM | POA: Diagnosis not present

## 2019-06-02 DIAGNOSIS — Z20828 Contact with and (suspected) exposure to other viral communicable diseases: Secondary | ICD-10-CM | POA: Diagnosis not present

## 2019-06-02 DIAGNOSIS — Z7982 Long term (current) use of aspirin: Secondary | ICD-10-CM | POA: Diagnosis not present

## 2019-06-02 DIAGNOSIS — K219 Gastro-esophageal reflux disease without esophagitis: Secondary | ICD-10-CM | POA: Diagnosis not present

## 2019-06-02 DIAGNOSIS — E039 Hypothyroidism, unspecified: Secondary | ICD-10-CM | POA: Diagnosis not present

## 2019-06-02 DIAGNOSIS — I10 Essential (primary) hypertension: Secondary | ICD-10-CM | POA: Diagnosis not present

## 2019-06-02 DIAGNOSIS — E785 Hyperlipidemia, unspecified: Secondary | ICD-10-CM | POA: Diagnosis not present

## 2019-06-02 DIAGNOSIS — K8010 Calculus of gallbladder with chronic cholecystitis without obstruction: Secondary | ICD-10-CM | POA: Diagnosis not present

## 2019-06-02 LAB — SARS CORONAVIRUS 2 (TAT 6-24 HRS): SARS Coronavirus 2: NEGATIVE

## 2019-06-03 ENCOUNTER — Other Ambulatory Visit: Payer: Self-pay

## 2019-06-03 ENCOUNTER — Observation Stay (HOSPITAL_COMMUNITY)
Admission: EM | Admit: 2019-06-03 | Discharge: 2019-06-03 | Disposition: A | Discharge status: Home / Self Care | Payer: Medicare HMO | Attending: Surgery | Admitting: Surgery

## 2019-06-03 ENCOUNTER — Observation Stay (HOSPITAL_COMMUNITY): Payer: Medicare HMO

## 2019-06-03 ENCOUNTER — Ambulatory Visit (HOSPITAL_COMMUNITY): Admission: RE | Admit: 2019-06-03 | Payer: Medicare HMO | Source: Home / Self Care | Admitting: Surgery

## 2019-06-03 ENCOUNTER — Observation Stay (HOSPITAL_COMMUNITY): Payer: Medicare HMO | Admitting: Vascular Surgery

## 2019-06-03 ENCOUNTER — Encounter (HOSPITAL_COMMUNITY): Payer: Self-pay | Admitting: Certified Registered Nurse Anesthetist

## 2019-06-03 ENCOUNTER — Emergency Department (HOSPITAL_COMMUNITY): Payer: Medicare HMO

## 2019-06-03 ENCOUNTER — Encounter (HOSPITAL_COMMUNITY)
Admission: EM | Disposition: A | Discharge status: Home / Self Care | Payer: Self-pay | Source: Home / Self Care | Attending: Emergency Medicine

## 2019-06-03 DIAGNOSIS — K8020 Calculus of gallbladder without cholecystitis without obstruction: Secondary | ICD-10-CM | POA: Diagnosis present

## 2019-06-03 DIAGNOSIS — K829 Disease of gallbladder, unspecified: Secondary | ICD-10-CM | POA: Diagnosis not present

## 2019-06-03 DIAGNOSIS — D72829 Elevated white blood cell count, unspecified: Secondary | ICD-10-CM | POA: Diagnosis not present

## 2019-06-03 DIAGNOSIS — Z03818 Encounter for observation for suspected exposure to other biological agents ruled out: Secondary | ICD-10-CM | POA: Diagnosis not present

## 2019-06-03 DIAGNOSIS — E039 Hypothyroidism, unspecified: Secondary | ICD-10-CM | POA: Diagnosis not present

## 2019-06-03 DIAGNOSIS — R1011 Right upper quadrant pain: Secondary | ICD-10-CM | POA: Diagnosis not present

## 2019-06-03 DIAGNOSIS — Z7982 Long term (current) use of aspirin: Secondary | ICD-10-CM | POA: Diagnosis not present

## 2019-06-03 DIAGNOSIS — Z20828 Contact with and (suspected) exposure to other viral communicable diseases: Secondary | ICD-10-CM | POA: Insufficient documentation

## 2019-06-03 DIAGNOSIS — Z79899 Other long term (current) drug therapy: Secondary | ICD-10-CM | POA: Diagnosis not present

## 2019-06-03 DIAGNOSIS — E785 Hyperlipidemia, unspecified: Secondary | ICD-10-CM | POA: Diagnosis not present

## 2019-06-03 DIAGNOSIS — K811 Chronic cholecystitis: Secondary | ICD-10-CM | POA: Diagnosis not present

## 2019-06-03 DIAGNOSIS — K8010 Calculus of gallbladder with chronic cholecystitis without obstruction: Principal | ICD-10-CM | POA: Insufficient documentation

## 2019-06-03 DIAGNOSIS — K219 Gastro-esophageal reflux disease without esophagitis: Secondary | ICD-10-CM | POA: Diagnosis not present

## 2019-06-03 DIAGNOSIS — Z79890 Hormone replacement therapy (postmenopausal): Secondary | ICD-10-CM | POA: Diagnosis not present

## 2019-06-03 DIAGNOSIS — I10 Essential (primary) hypertension: Secondary | ICD-10-CM | POA: Diagnosis not present

## 2019-06-03 DIAGNOSIS — K8080 Other cholelithiasis without obstruction: Secondary | ICD-10-CM

## 2019-06-03 DIAGNOSIS — K8018 Calculus of gallbladder with other cholecystitis without obstruction: Secondary | ICD-10-CM | POA: Diagnosis not present

## 2019-06-03 HISTORY — DX: Calculus of gallbladder without cholecystitis without obstruction: K80.20

## 2019-06-03 HISTORY — PX: CHOLECYSTECTOMY: SHX55

## 2019-06-03 LAB — COMPREHENSIVE METABOLIC PANEL
ALT: 15 U/L (ref 0–44)
AST: 24 U/L (ref 15–41)
Albumin: 3.5 g/dL (ref 3.5–5.0)
Alkaline Phosphatase: 46 U/L (ref 38–126)
Anion gap: 11 (ref 5–15)
BUN: 11 mg/dL (ref 8–23)
CO2: 22 mmol/L (ref 22–32)
Calcium: 8.6 mg/dL — ABNORMAL LOW (ref 8.9–10.3)
Chloride: 97 mmol/L — ABNORMAL LOW (ref 98–111)
Creatinine, Ser: 0.98 mg/dL (ref 0.61–1.24)
GFR calc Af Amer: >60 mL/min (ref >60)
GFR calc non Af Amer: >60 mL/min (ref >60)
Glucose, Bld: 150 mg/dL — ABNORMAL HIGH (ref 70–99)
Potassium: 3.7 mmol/L (ref 3.5–5.1)
Sodium: 130 mmol/L — ABNORMAL LOW (ref 135–145)
Total Bilirubin: 0.5 mg/dL (ref 0.3–1.2)
Total Protein: 5.6 g/dL — ABNORMAL LOW (ref 6.5–8.1)

## 2019-06-03 LAB — CBC WITH DIFFERENTIAL/PLATELET
Abs Immature Granulocytes: 0.08 10*3/uL — ABNORMAL HIGH (ref 0.00–0.07)
Basophils Absolute: 0 10*3/uL (ref 0.0–0.1)
Basophils Relative: 0 %
Eosinophils Absolute: 0 10*3/uL (ref 0.0–0.5)
Eosinophils Relative: 0 %
HCT: 38.3 % — ABNORMAL LOW (ref 39.0–52.0)
Hemoglobin: 12.9 g/dL — ABNORMAL LOW (ref 13.0–17.0)
Immature Granulocytes: 1 %
Lymphocytes Relative: 4 %
Lymphs Abs: 0.6 10*3/uL — ABNORMAL LOW (ref 0.7–4.0)
MCH: 30.4 pg (ref 26.0–34.0)
MCHC: 33.7 g/dL (ref 30.0–36.0)
MCV: 90.3 fL (ref 80.0–100.0)
Monocytes Absolute: 0.9 10*3/uL (ref 0.1–1.0)
Monocytes Relative: 6 %
Neutro Abs: 12.7 10*3/uL — ABNORMAL HIGH (ref 1.7–7.7)
Neutrophils Relative %: 89 %
Platelets: 316 10*3/uL (ref 150–400)
RBC: 4.24 MIL/uL (ref 4.22–5.81)
RDW: 13.2 % (ref 11.5–15.5)
WBC: 14.3 10*3/uL — ABNORMAL HIGH (ref 4.0–10.5)
nRBC: 0 % (ref 0.0–0.2)

## 2019-06-03 LAB — SARS CORONAVIRUS 2 BY RT PCR (HOSPITAL ORDER, PERFORMED IN ~~LOC~~ HOSPITAL LAB): SARS Coronavirus 2: NEGATIVE

## 2019-06-03 LAB — LIPASE, BLOOD: Lipase: 19 U/L (ref 11–51)

## 2019-06-03 SURGERY — LAPAROSCOPIC CHOLECYSTECTOMY WITH INTRAOPERATIVE CHOLANGIOGRAM
Anesthesia: General | Site: Abdomen

## 2019-06-03 MED ORDER — PHENYLEPHRINE 40 MCG/ML (10ML) SYRINGE FOR IV PUSH (FOR BLOOD PRESSURE SUPPORT)
PREFILLED_SYRINGE | INTRAVENOUS | Status: DC | PRN
  Administered 2019-06-03: 120 ug via INTRAVENOUS
  Administered 2019-06-03: 80 ug via INTRAVENOUS

## 2019-06-03 MED ORDER — ONDANSETRON HCL 4 MG/2ML IJ SOLN
INTRAMUSCULAR | Status: DC | PRN
  Administered 2019-06-03: 4 mg via INTRAVENOUS

## 2019-06-03 MED ORDER — 0.9 % SODIUM CHLORIDE (POUR BTL) OPTIME
TOPICAL | Status: DC | PRN
  Administered 2019-06-03: 1000 mL

## 2019-06-03 MED ORDER — CHLORHEXIDINE GLUCONATE CLOTH 2 % EX PADS: 6.0000 | MEDICATED_PAD | Freq: Once | CUTANEOUS | Status: DC

## 2019-06-03 MED ORDER — MEPERIDINE HCL 25 MG/ML IJ SOLN: 6.2500 mg | INTRAMUSCULAR | Status: DC | PRN

## 2019-06-03 MED ORDER — BUPIVACAINE-EPINEPHRINE (PF) 0.25% -1:200000 IJ SOLN
INTRAMUSCULAR | Status: AC
  Filled 2019-06-03: qty 30

## 2019-06-03 MED ORDER — HEMOSTATIC AGENTS (NO CHARGE) OPTIME
TOPICAL | Status: DC | PRN
  Administered 2019-06-03: 1 via TOPICAL

## 2019-06-03 MED ORDER — LIDOCAINE 2% (20 MG/ML) 5 ML SYRINGE
INTRAMUSCULAR | Status: DC | PRN
  Administered 2019-06-03: 100 mg via INTRAVENOUS

## 2019-06-03 MED ORDER — PROPOFOL 10 MG/ML IV BOLUS
INTRAVENOUS | Status: DC | PRN
  Administered 2019-06-03: 120 mg via INTRAVENOUS

## 2019-06-03 MED ORDER — HYDROMORPHONE HCL 1 MG/ML IJ SOLN
0.2500 mg | INTRAMUSCULAR | Status: DC | PRN
  Administered 2019-06-03 (×4): 0.5 mg via INTRAVENOUS

## 2019-06-03 MED ORDER — FENTANYL CITRATE (PF) 100 MCG/2ML IJ SOLN
INTRAMUSCULAR | Status: DC | PRN
  Administered 2019-06-03: 100 ug via INTRAVENOUS
  Administered 2019-06-03 (×2): 50 ug via INTRAVENOUS

## 2019-06-03 MED ORDER — ONDANSETRON HCL 4 MG/2ML IJ SOLN: 4.0000 mg | Freq: Once | INTRAMUSCULAR | Status: DC | PRN

## 2019-06-03 MED ORDER — SODIUM CHLORIDE 0.9 % IV SOLN: 2.0000 g | INTRAVENOUS | Status: DC

## 2019-06-03 MED ORDER — ROCURONIUM BROMIDE 100 MG/10ML IV SOLN
INTRAVENOUS | Status: DC | PRN
  Administered 2019-06-03: 80 mg via INTRAVENOUS

## 2019-06-03 MED ORDER — SUGAMMADEX SODIUM 200 MG/2ML IV SOLN
INTRAVENOUS | Status: DC | PRN
  Administered 2019-06-03: 200 mg via INTRAVENOUS

## 2019-06-03 MED ORDER — DEXAMETHASONE SODIUM PHOSPHATE 10 MG/ML IJ SOLN
INTRAMUSCULAR | Status: DC | PRN
  Administered 2019-06-03: 10 mg via INTRAVENOUS

## 2019-06-03 MED ORDER — PROPOFOL 10 MG/ML IV BOLUS
INTRAVENOUS | Status: AC
  Filled 2019-06-03: qty 20

## 2019-06-03 MED ORDER — HYDROMORPHONE HCL 1 MG/ML IJ SOLN
INTRAMUSCULAR | Status: AC
  Filled 2019-06-03: qty 1

## 2019-06-03 MED ORDER — SODIUM CHLORIDE 0.9 % IV SOLN
INTRAVENOUS | Status: DC | PRN
  Administered 2019-06-03: 6 mL

## 2019-06-03 MED ORDER — METHOCARBAMOL 500 MG PO TABS: 500.0000 mg | ORAL_TABLET | Freq: Four times a day (QID) | ORAL | Status: DC | PRN

## 2019-06-03 MED ORDER — MORPHINE SULFATE (PF) 4 MG/ML IV SOLN
4.0000 mg | Freq: Once | INTRAVENOUS | Status: AC
  Administered 2019-06-03: 4 mg via INTRAVENOUS
  Filled 2019-06-03: qty 1

## 2019-06-03 MED ORDER — BUPIVACAINE-EPINEPHRINE 0.25% -1:200000 IJ SOLN
INTRAMUSCULAR | Status: DC | PRN
  Administered 2019-06-03: 15 mL

## 2019-06-03 MED ORDER — ACETAMINOPHEN 500 MG PO TABS
1000.0000 mg | ORAL_TABLET | ORAL | Status: AC
  Administered 2019-06-03: 1000 mg via ORAL

## 2019-06-03 MED ORDER — CEFAZOLIN SODIUM-DEXTROSE 2-4 GM/100ML-% IV SOLN
2.0000 g | INTRAVENOUS | Status: AC
  Administered 2019-06-03: 14:00:00 2 g via INTRAVENOUS

## 2019-06-03 MED ORDER — SODIUM CHLORIDE 0.9 % IV SOLN
INTRAVENOUS | Status: DC | PRN
  Administered 2019-06-03: 20 ug/min via INTRAVENOUS

## 2019-06-03 MED ORDER — OXYCODONE HCL 5 MG PO TABS: 5.0000 mg | ORAL_TABLET | ORAL | Status: DC | PRN

## 2019-06-03 MED ORDER — OXYCODONE HCL 5 MG PO TABS: 5.0000 mg | ORAL_TABLET | Freq: Four times a day (QID) | ORAL | 0 refills | Status: DC | PRN

## 2019-06-03 MED ORDER — LACTATED RINGERS IV SOLN
INTRAVENOUS | Status: DC
  Administered 2019-06-03: 13:00:00 via INTRAVENOUS

## 2019-06-03 MED ORDER — SODIUM CHLORIDE 0.9 % IR SOLN
Status: DC | PRN
  Administered 2019-06-03: 1000 mL

## 2019-06-03 MED ORDER — FENTANYL CITRATE (PF) 250 MCG/5ML IJ SOLN
INTRAMUSCULAR | Status: AC
  Filled 2019-06-03: qty 5

## 2019-06-03 MED ORDER — GABAPENTIN 300 MG PO CAPS
300.0000 mg | ORAL_CAPSULE | ORAL | Status: AC
  Administered 2019-06-03: 300 mg via ORAL

## 2019-06-03 SURGICAL SUPPLY — 39 items
APPLIER CLIP ROT 10 11.4 M/L (STAPLE) ×2
BENZOIN TINCTURE PRP APPL 2/3 (GAUZE/BANDAGES/DRESSINGS) ×2 IMPLANT
CANISTER SUCT 3000ML PPV (MISCELLANEOUS) ×2 IMPLANT
CHLORAPREP W/TINT 26 (MISCELLANEOUS) ×2 IMPLANT
CLIP APPLIE ROT 10 11.4 M/L (STAPLE) ×1 IMPLANT
COVER MAYO STAND STRL (DRAPES) ×2 IMPLANT
COVER SURGICAL LIGHT HANDLE (MISCELLANEOUS) ×2 IMPLANT
DRAPE C-ARM 42X72 X-RAY (DRAPES) ×2 IMPLANT
DRSG TEGADERM 2-3/8X2-3/4 SM (GAUZE/BANDAGES/DRESSINGS) ×6 IMPLANT
DRSG TEGADERM 4X4.75 (GAUZE/BANDAGES/DRESSINGS) ×2 IMPLANT
ELECT REM PT RETURN 9FT ADLT (ELECTROSURGICAL) ×2
ELECTRODE REM PT RTRN 9FT ADLT (ELECTROSURGICAL) ×1 IMPLANT
GAUZE SPONGE 2X2 8PLY STRL LF (GAUZE/BANDAGES/DRESSINGS) ×1 IMPLANT
GLOVE BIO SURGEON STRL SZ7 (GLOVE) ×2 IMPLANT
GLOVE BIOGEL PI IND STRL 7.5 (GLOVE) ×1 IMPLANT
GLOVE BIOGEL PI INDICATOR 7.5 (GLOVE) ×1
GOWN STRL REUS W/ TWL LRG LVL3 (GOWN DISPOSABLE) ×3 IMPLANT
GOWN STRL REUS W/TWL LRG LVL3 (GOWN DISPOSABLE) ×3
KIT BASIN OR (CUSTOM PROCEDURE TRAY) ×2 IMPLANT
KIT TURNOVER KIT B (KITS) ×2 IMPLANT
NS IRRIG 1000ML POUR BTL (IV SOLUTION) ×2 IMPLANT
PAD ARMBOARD 7.5X6 YLW CONV (MISCELLANEOUS) ×2 IMPLANT
POUCH SPECIMEN RETRIEVAL 10MM (ENDOMECHANICALS) ×2 IMPLANT
SCISSORS LAP 5X35 DISP (ENDOMECHANICALS) ×2 IMPLANT
SET CHOLANGIOGRAPH 5 50 .035 (SET/KITS/TRAYS/PACK) ×2 IMPLANT
SET IRRIG TUBING LAPAROSCOPIC (IRRIGATION / IRRIGATOR) ×2 IMPLANT
SET TUBE SMOKE EVAC HIGH FLOW (TUBING) ×2 IMPLANT
SLEEVE ENDOPATH XCEL 5M (ENDOMECHANICALS) ×2 IMPLANT
SPECIMEN JAR SMALL (MISCELLANEOUS) ×2 IMPLANT
SPONGE GAUZE 2X2 STER 10/PKG (GAUZE/BANDAGES/DRESSINGS) ×1
STRIP CLOSURE SKIN 1/2X4 (GAUZE/BANDAGES/DRESSINGS) ×2 IMPLANT
SUT MNCRL AB 4-0 PS2 18 (SUTURE) ×2 IMPLANT
TOWEL GREEN STERILE (TOWEL DISPOSABLE) ×2 IMPLANT
TOWEL GREEN STERILE FF (TOWEL DISPOSABLE) ×2 IMPLANT
TRAY LAPAROSCOPIC MC (CUSTOM PROCEDURE TRAY) ×2 IMPLANT
TROCAR XCEL BLUNT TIP 100MML (ENDOMECHANICALS) ×2 IMPLANT
TROCAR XCEL NON-BLD 11X100MML (ENDOMECHANICALS) ×2 IMPLANT
TROCAR XCEL NON-BLD 5MMX100MML (ENDOMECHANICALS) ×2 IMPLANT
WATER STERILE IRR 1000ML POUR (IV SOLUTION) ×2 IMPLANT

## 2019-06-03 NOTE — Anesthesia Procedure Notes (Signed)
Procedure Name: Intubation Performed by: Candis Shine, CRNA Pre-anesthesia Checklist: Patient identified, Emergency Drugs available, Suction available and Patient being monitored Patient Re-evaluated:Patient Re-evaluated prior to induction Oxygen Delivery Method: Circle System Utilized Preoxygenation: Pre-oxygenation with 100% oxygen Induction Type: IV induction Ventilation: Mask ventilation without difficulty and Oral airway inserted - appropriate to patient size Laryngoscope Size: 4 and Mac Grade View: Grade I Tube type: Oral Tube size: 7.5 mm Number of attempts: 1 Airway Equipment and Method: Stylet and Oral airway Placement Confirmation: ETT inserted through vocal cords under direct vision,  positive ETCO2 and breath sounds checked- equal and bilateral Secured at: 22 cm Tube secured with: Tape Dental Injury: Teeth and Oropharynx as per pre-operative assessment

## 2019-06-03 NOTE — Transfer of Care (Signed)
Immediate Anesthesia Transfer of Care Note  Patient: Mark Ewing  Procedure(s) Performed: LAPAROSCOPIC CHOLECYSTECTOMY WITH INTRAOPERATIVE CHOLANGIOGRAM (N/A Abdomen)  Patient Location: PACU  Anesthesia Type:General  Level of Consciousness: awake, alert  and oriented  Airway & Oxygen Therapy: Patient Spontanous Breathing and Patient connected to face mask oxygen  Post-op Assessment: Report given to RN and Post -op Vital signs reviewed and stable  Post vital signs: Reviewed and stable  Last Vitals:  Vitals Value Taken Time  BP 147/59 06/03/19 1451  Temp    Pulse 87 06/03/19 1453  Resp 21 06/03/19 1453  SpO2 94 % 06/03/19 1453  Vitals shown include unvalidated device data.  Last Pain:  Vitals:   06/03/19 1303  TempSrc:   PainSc: 0-No pain      Patients Stated Pain Goal: 4 (21/97/58 8325)  Complications: No apparent anesthesia complications

## 2019-06-03 NOTE — ED Provider Notes (Signed)
[facility] EMERGENCY DEPARTMENT Provider Note   CSN: [id number] Arrival date & time: 06/03/19  0548    History   Chief Complaint Chief Complaint  Patient presents with  . Abdominal Pain    HPI Mark Ewing is a 78 y.o. male with a hx of hypertension, carotid artery stenosis, hyperlipidemia, GERD presents to the Emergency Department complaining of gradual, persistent, progressively worsening right upper quadrant abdominal pain onset 3 days ago.  Patient reports taking Tylenol, Ibuprofen and trying new positions without relief.  He reports he has a history of " bad gallbladder" and is scheduled to have it removed by Dr. Gershon Crane on Wednesday however the pain became too bad.  He has associated nausea without vomiting.  Patient reports associated diarrhea which is been ongoing for several weeks.  Patient denies fever, chills, headache, neck pain, chest pain, shortness of breath, weakness, dizziness, syncope.  No aggravating factors.     The history is provided by the patient and medical records. No language interpreter was used.    Past Medical History:  Diagnosis Date  . 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 Carotid artery stenosis    6/75/91 [place]: 63-84% LICA stenosis by velocities, CTA neck rec that showed no sign LICA stenosis  . GERD (gastroesophageal reflux disease)   . Hyperlipidemia   . Hypertension   . Hypothyroidism   . Inguinal hernia   . Thyroid disease   . Thyroid nodule     There are no active problems to display for this patient.   Past Surgical History:  Procedure Laterality Date  . CATARACT EXTRACTION W/ INTRAOCULAR LENS  IMPLANT, BILATERAL    . HERNIA REPAIR    . TONSILLECTOMY          Home Medications    Prior to Admission medications   Medication Sig Start Date End Date Taking? Authorizing Provider  acetaminophen (TYLENOL) 500 MG tablet Take 500 mg by mouth every 6 (six) hours as  needed (pain.).    [provider]  aspirin 81 MG tablet Take 81 mg by mouth daily.    [provider]  co-enzyme Q-10 50 MG capsule Take 100 mg by mouth daily.    [provider]  famotidine (PEPCID) 20 MG tablet Take 20 mg by mouth 2 (two) times daily.     [provider]  ibuprofen (ADVIL) 200 MG tablet Take 200 mg by mouth 2 (two) times daily as needed (for pain).    [provider]  levothyroxine (SYNTHROID, LEVOTHROID) 88 MCG tablet Take 88 mcg by mouth daily before breakfast.     [provider]  losartan (COZAAR) 50 MG tablet Take 50 mg by mouth daily.  12/08/18   [provider]  simvastatin (ZOCOR) 10 MG tablet Take 10 mg by mouth daily.    [provider]  Turmeric 500 MG CAPS Take 500 mg by mouth daily.    [provider]  verapamil (CALAN-SR) 180 MG CR tablet Take 180 mg by mouth 2 (two) times daily.     [provider]    Family History Family History  Problem Relation Age of Onset  . Hypertension Mother   . Ovarian cancer Mother   . Hypertension Father   . CVA Father   . Prostate cancer Father   . Hypertension Brother   . Hypertension Paternal Grandfather   . CVA Paternal Grandfather  Social History Social History   Tobacco Use  . Smoking status: Never Smoker  . Smokeless tobacco: Never Used  Substance Use Topics  . Alcohol use: No  . Drug use: No     Allergies   Patient has no known allergies.   Review of Systems Review of Systems  Constitutional: Negative for appetite change, diaphoresis, fatigue, fever and unexpected weight change.  HENT: Negative for mouth sores.   Eyes: Negative for visual disturbance.  Respiratory: Negative for cough, chest tightness, shortness of breath and wheezing.   Cardiovascular: Negative for chest pain.  Gastrointestinal: Positive for abdominal pain and nausea. Negative for constipation, diarrhea and vomiting.  Endocrine: Negative  for polydipsia, polyphagia and polyuria.  Genitourinary: Negative for dysuria, frequency, hematuria and urgency.  Musculoskeletal: Negative for back pain and neck stiffness.  Skin: Negative for rash.  Allergic/Immunologic: Negative for immunocompromised state.  Neurological: Negative for syncope, light-headedness and headaches.  Hematological: Does not bruise/bleed easily.  Psychiatric/Behavioral: Negative for sleep disturbance. The patient is not nervous/anxious.      Physical Exam Updated Vital Signs BP (!) 146/75   Pulse 81   Temp 98.5 F (36.9 C) (Oral)   Resp (!) 22   Ht 5\' 9"  (1.753 m)   Wt 78.9 kg   SpO2 96%   BMI 25.70 kg/m   Physical Exam Vitals signs and nursing note reviewed.  Constitutional:      General: He is not in acute distress.    Appearance: He is not diaphoretic.  HENT:     Head: Normocephalic.  Eyes:     General: No scleral icterus.    Conjunctiva/sclera: Conjunctivae normal.  Neck:     Musculoskeletal: Normal range of motion.  Cardiovascular:     Rate and Rhythm: Normal rate and regular rhythm.     Pulses: Normal pulses.          Radial pulses are 2+ on the right side and 2+ on the left side.  Pulmonary:     Effort: No tachypnea, accessory muscle usage, prolonged expiration, respiratory distress or retractions.     Breath sounds: No stridor.     Comments: Equal chest rise. No increased work of breathing. Abdominal:     General: There is no distension.     Palpations: Abdomen is soft.     Tenderness: There is abdominal tenderness in the right upper quadrant and epigastric area. There is no guarding or rebound.  Musculoskeletal:     Comments: Moves all extremities equally and without difficulty.  Skin:    General: Skin is warm and dry.     Capillary Refill: Capillary refill takes less than 2 seconds.  Neurological:     Mental Status: He is alert.     GCS: GCS eye subscore is 4. GCS verbal subscore is 5. GCS motor subscore is 6.      Comments: Speech is clear and goal oriented.  Psychiatric:        Mood and Affect: Mood normal.      ED Treatments / Results  Labs (all labs ordered are listed, but only abnormal results are displayed) Labs Reviewed  CBC WITH DIFFERENTIAL/PLATELET - Abnormal; Notable for the following components:      Result Value   WBC 14.3 (*)    Hemoglobin 12.9 (*)    HCT 38.3 (*)    Neutro Abs 12.7 (*)    Lymphs Abs 0.6 (*)    Abs Immature Granulocytes 0.08 (*)    All other components  within normal limits  COMPREHENSIVE METABOLIC PANEL - Abnormal; Notable for the following components:   Sodium 130 (*)    Chloride 97 (*)    Glucose, Bld 150 (*)    Calcium 8.6 (*)    Total Protein 5.6 (*)    All other components within normal limits  LIPASE, BLOOD    EKG EKG Interpretation  Date/Time:  Sunday June 03 2019 06:08:07 EDT Ventricular Rate:  82 PR Interval:    QRS Duration: 110 QT Interval:  415 QTC Calculation: 485 R Axis:   42 Text Interpretation:  Sinus rhythm Consider left atrial enlargement Abnormal R-wave progression, early transition Abnormal inferior Q waves Borderline prolonged QT interval Confirmed by Ripley Fraise [id number]) on 06/03/2019 6:21:35 AM   Procedures Procedures (including critical care time)  Medications Ordered in ED Medications - No data to display   Initial Impression / Assessment and Plan / ED Course  I have reviewed the triage vital signs and the nursing notes.  Pertinent labs & imaging results that were available during my care of the patient were reviewed by me and considered in my medical decision making (see chart for details).        Patient presents to the emergency department with right upper quadrant abdominal pain.  He has a history of cholelithiasis.  Patient is scheduled for cholecystectomy in several days however suspect he may have cholecystitis.  Labs and ultrasound pending.  Patient afebrile on arrival.  At shift change care was  transferred to Suella Broad, PA-C who will follow pending studies, re-evaulate and determine disposition.     Final Clinical Impressions(s) / ED Diagnoses   Final diagnoses:  Right upper quadrant abdominal pain    ED Discharge Orders    None       Navarro Nine, Gwenlyn Perking 06/03/19 0700    Ripley Fraise, MD 06/03/19 [phone]

## 2019-06-03 NOTE — Op Note (Signed)
Laparoscopic Cholecystectomy with IOC Procedure Note  Indications: This patient presents with symptomatic gallbladder disease and will undergo laparoscopic cholecystectomy.  Pre-operative Diagnosis: Calculus of gallbladder with other cholecystitis, without mention of obstruction  Post-operative Diagnosis: Same  Surgeon: Maia Petties   Assistants:Peyton Ouida Sills, RNFA  Anesthesia: General endotracheal anesthesia  ASA Class: 2  Procedure Details  The patient was seen again in the Holding Room. The risks, benefits, complications, treatment options, and expected outcomes were discussed with the patient. The possibilities of reaction to medication, pulmonary aspiration, perforation of viscus, bleeding, recurrent infection, finding a normal gallbladder, the need for additional procedures, failure to diagnose a condition, the possible need to convert to an open procedure, and creating a complication requiring transfusion or operation were discussed with the patient. The likelihood of improving the patient's symptoms with return to their baseline status is good.  The patient and/or family concurred with the proposed plan, giving informed consent. The site of surgery properly noted. The patient was taken to Operating Room, identified as Mark Ewing and the procedure verified as Laparoscopic Cholecystectomy with Intraoperative Cholangiogram. A Time Out was held and the above information confirmed.  Prior to the induction of general anesthesia, antibiotic prophylaxis was administered. General endotracheal anesthesia was then administered and tolerated well. After the induction, the abdomen was prepped with Chloraprep and draped in the sterile fashion. The patient was positioned in the supine position.  Local anesthetic agent was injected into the skin near the umbilicus and an incision made. We dissected down to the abdominal fascia with blunt dissection.  The fascia was incised vertically and we  entered the peritoneal cavity bluntly.  A pursestring suture of 0-Vicryl was placed around the fascial opening.  The Hasson cannula was inserted and secured with the stay suture.  Pneumoperitoneum was then created with CO2 and tolerated well without any adverse changes in the patient's vital signs. An 11-mm port was placed in the subxiphoid position.  Two 5-mm ports were placed in the right upper quadrant. All skin incisions were infiltrated with a local anesthetic agent before making the incision and placing the trocars.   We positioned the patient in reverse Trendelenburg, tilted slightly to the patient's left.  The gallbladder was identified, the fundus grasped and retracted cephalad. The gallbladder is very thin-walled and tore easily. No stones were spilled, but we did suction out some bile.  There were some adhesions of the omentum to the gallbladder and liver edge.  Adhesions were lysed bluntly and with the electrocautery where indicated, taking care not to injure any adjacent organs or viscus. The infundibulum was grasped and retracted laterally, exposing the peritoneum overlying the triangle of Calot. This was then divided and exposed in a blunt fashion. A critical view of the cystic duct and cystic artery was obtained.  The cystic duct was clearly identified and bluntly dissected circumferentially. The cystic duct was ligated with a clip distally.   An incision was made in the cystic duct and the [facility] cholangiogram catheter introduced. The catheter was secured using a clip. A cholangiogram was then obtained which showed good visualization of the distal and proximal biliary tree with no sign of filling defects or obstruction.  Contrast flowed easily into the duodenum. The catheter was then removed.   The cystic duct was then ligated with clips and divided. The cystic artery was identified, dissected free, ligated with clips and divided as well.   The gallbladder was dissected from the liver bed in  retrograde fashion  with the electrocautery. The gallbladder was removed and placed in an Endocatch sac. The liver bed was irrigated and inspected. Hemostasis was achieved with the electrocautery and SNOW. Copious irrigation was utilized and was repeatedly aspirated until clear.  The gallbladder and Endocatch sac were then removed through the umbilical port site.  The pursestring suture was used to close the umbilical fascia.    We again inspected the right upper quadrant for hemostasis.  Pneumoperitoneum was released as we removed the trocars.  4-0 Monocryl was used to close the skin.   Benzoin, steri-strips, and clean dressings were applied. The patient was then extubated and brought to the recovery room in stable condition. Instrument, sponge, and needle counts were correct at closure and at the conclusion of the case.   Findings: Cholecystitis with Cholelithiasis  Estimated Blood Loss: less than 50 mL         Drains: none         Specimens: Gallbladder           Complications: None; patient tolerated the procedure well.         Disposition: PACU - hemodynamically stable.         Condition: stable  Imogene Burn. Georgette Dover, MD, [facility] [phone]  06/03/2019 2:24 PM

## 2019-06-03 NOTE — Progress Notes (Signed)
Holding patient per Tsuei MD request.

## 2019-06-03 NOTE — ED Triage Notes (Signed)
Pt POV d/t RUQ pain. States he's suppose to have cholecystomy this week   Pt last took Ibuprofen at 0300.

## 2019-06-03 NOTE — ED Provider Notes (Signed)
78yo male with cholelithiasis, scheduled for cholecystectomy on Wednesday. Worsening pain, nausea without vomiting. Labs and [place] pending. Plan is to call surgery after results.  Physical Exam  BP 137/76   Pulse 66   Temp 98.5 F (36.9 C) (Oral)   Resp 15   Ht 5\' 9"  (1.753 m)   Wt 78.9 kg   SpO2 96%   BMI 25.70 kg/m   Physical Exam Alert, pleasant gentleman. Pain well controlled with Morphine in the ER. Respirations even and unlabored. ED Course/Procedures     Procedures  MDM   Patient reports gallbladder problems/pain x 6 weeks, delay in surgery due to COVID. Patient has been able to manage pain with diet and Tylenol however pain has been worse for the past 3 days and was unable to get comfortable last night which prompted his ER visit. Associated with nausea. Denies fevers, chills, vomiting.  Patient had negative COVID swab yesterday as part of preop planning, has quarantined at home since then and prior to coming to the ER.  [place] with gallstones, no acute cholelithiasis. CBC with elevated WBC at 14.3 today, increased from 5.6 on 05/30/2019. Surgery paged for consult.  Case discussed with Jerene Pitch, PA-C on call with surgery who has seen the patient. Anesthesiology requests repeat COVID swab.    Tacy Learn, PA-C 06/03/19 1008    Isla Pence, MD 06/03/19 [phone]

## 2019-06-03 NOTE — H&P (Signed)
[facility] Surgery Admission Note  Mark Ewing 04-Jul-1941  [id number].    Requesting MD: Isla Pence Chief Complaint/Reason for Consult: abdominal pain  HPI:  Mark Ewing is a 78yo male PMH HTN, GERD, hypothyroidism, and HLD who is scheduled for an elective laparoscopic cholecystectomy  8/19 with Dr. Georgette Dover, but presented to the [facility] earlier this morning with worsening abdominal pain. States that he has had intermittent pain over the last 8 months, but over the last 3 days his pain has been more frequent. Pain is mostly in his upper abdomen and radiates into his back. Worse at night. Some mild nausea, no emesis. Last BM was yesterday and loose. Denies fever or chills. He tried going on a liquid diet but this only minimally helped. He is NPO today. ED workup included u/s which shows cholelithiasis without evidence for acute cholecystitis, no biliary dilatation. WBC 14.3. lipase and LFTs WNL.  Patient had his preoperative covid 19 test yesterday. This resulted as negative. States that after the test he went straight home and went no where else until he came to the ED.  Abdominal surgical history: bilateral inguinal hernia repair Anticoagulants: none  ROS: Review of Systems  Constitutional: Negative.  Negative for chills and fever.  HENT: Negative.   Eyes: Negative.   Respiratory: Negative.   Cardiovascular: Negative.   Gastrointestinal: Positive for abdominal pain, diarrhea and nausea. Negative for constipation and vomiting.  Genitourinary: Negative.   Musculoskeletal: Positive for back pain.  Skin: Negative.   Neurological: Negative.    All systems reviewed and otherwise negative except for as above  Family History  Problem Relation Age of Onset  . Hypertension Mother   . Ovarian cancer Mother   . Hypertension Father   . CVA Father   . Prostate cancer Father   . Hypertension Brother   . Hypertension Paternal Grandfather   . CVA Paternal Grandfather      Past Medical History:  Diagnosis Date  . 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 Carotid artery stenosis    2/83/66 [place]: 29-47% LICA stenosis by velocities, CTA neck rec that showed no sign LICA stenosis  . GERD (gastroesophageal reflux disease)   . Hyperlipidemia   . Hypertension   . Hypothyroidism   . Inguinal hernia   . Thyroid disease   . Thyroid nodule     Past Surgical History:  Procedure Laterality Date  . CATARACT EXTRACTION W/ INTRAOCULAR LENS  IMPLANT, BILATERAL    . HERNIA REPAIR    . TONSILLECTOMY      Social History:  reports that he has never smoked. He has never used smokeless tobacco. He reports that he does not drink alcohol or use drugs.  Allergies: No Known Allergies  (Not in a hospital admission)   Prior to Admission medications   Medication Sig Start Date End Date Taking? Authorizing Provider  acetaminophen (TYLENOL) 500 MG tablet Take 500 mg by mouth every 6 (six) hours as needed (pain.).    [provider]  aspirin 81 MG tablet Take 81 mg by mouth daily.    [provider]  co-enzyme Q-10 50 MG capsule Take 100 mg by mouth daily.    [provider]  famotidine (PEPCID) 20 MG tablet Take 20 mg by mouth 2 (two) times daily.     [provider]  ibuprofen (ADVIL) 200 MG tablet Take 200 mg by mouth 2 (two) times daily as needed (  for pain).    [provider]  levothyroxine (SYNTHROID, LEVOTHROID) 88 MCG tablet Take 88 mcg by mouth daily before breakfast.     [provider]  losartan (COZAAR) 50 MG tablet Take 50 mg by mouth daily.  12/08/18   [provider]  simvastatin (ZOCOR) 10 MG tablet Take 10 mg by mouth daily.    [provider]  Turmeric 500 MG CAPS Take 500 mg by mouth daily.    [provider]  verapamil (CALAN-SR) 180 MG CR tablet Take 180 mg by mouth 2 (two) times daily.     [provider]    Blood pressure  137/76, pulse 66, temperature 98.5 F (36.9 C), temperature source Oral, resp. rate 15, height 5\' 9"  (1.753 m), weight 78.9 kg, SpO2 96 %. Physical Exam: General: pleasant, WD/WN white male who is laying in bed in NAD HEENT: head is normocephalic, atraumatic.  Sclera are noninjected.  Pupils equal and round.  Ears and nose without any masses or lesions.  Mouth is pink and moist. Dentition fair Heart: regular, rate, and rhythm.  No obvious murmurs, gallops, or rubs noted.  Palpable pedal pulses bilaterally Lungs: CTAB, no wheezes, rhonchi, or rales noted.  Respiratory effort nonlabored Abd: soft, distended, +BS, no masses, hernias, or organomegaly. Mild RUQ/epigastric TTP (just received morphine). No rebound or guarding MS: all 4 extremities are symmetrical with no cyanosis, clubbing, or edema. Skin: warm and dry with no masses, lesions, or rashes Psych: A&Ox3 with an appropriate affect. Neuro: cranial nerves grossly intact, extremity CSM intact bilaterally, normal speech  Results for orders placed or performed during the hospital encounter of 06/03/19 (from the past 48 hour(s))  CBC with Differential/Platelet     Status: Abnormal   Collection Time: 06/03/19  6:17 AM  Result Value Ref Range   WBC 14.3 (H) 4.0 - 10.5 K/uL   RBC 4.24 4.22 - 5.81 MIL/uL   Hemoglobin 12.9 (L) 13.0 - 17.0 g/dL   HCT 38.3 (L) 39.0 - 52.0 %   MCV 90.3 80.0 - 100.0 fL   MCH 30.4 26.0 - 34.0 pg   MCHC 33.7 30.0 - 36.0 g/dL   RDW 13.2 11.5 - 15.5 %   Platelets 316 150 - 400 K/uL   nRBC 0.0 0.0 - 0.2 %   Neutrophils Relative % 89 %   Neutro Abs 12.7 (H) 1.7 - 7.7 K/uL   Lymphocytes Relative 4 %   Lymphs Abs 0.6 (L) 0.7 - 4.0 K/uL   Monocytes Relative 6 %   Monocytes Absolute 0.9 0.1 - 1.0 K/uL   Eosinophils Relative 0 %   Eosinophils Absolute 0.0 0.0 - 0.5 K/uL   Basophils Relative 0 %   Basophils Absolute 0.0 0.0 - 0.1 K/uL   Immature Granulocytes 1 %   Abs Immature Granulocytes 0.08 (H) 0.00 - 0.07 K/uL     Comment: Performed at [facility], [address], [place], [place] [address]  Comprehensive metabolic panel     Status: Abnormal   Collection Time: 06/03/19  6:17 AM  Result Value Ref Range   Sodium 130 (L) 135 - 145 mmol/L   Potassium 3.7 3.5 - 5.1 mmol/L   Chloride 97 (L) 98 - 111 mmol/L   CO2 22 22 - 32 mmol/L   Glucose, Bld 150 (H) 70 - 99 mg/dL   BUN 11 8 - 23 mg/dL   Creatinine, Ser 0.98 0.61 - 1.24 mg/dL   Calcium 8.6 (L) 8.9 - 10.3  mg/dL   Total Protein 5.6 (L) 6.5 - 8.1 g/dL   Albumin 3.5 3.5 - 5.0 g/dL   AST 24 15 - 41 U/L   ALT 15 0 - 44 U/L   Alkaline Phosphatase 46 38 - 126 U/L   Total Bilirubin 0.5 0.3 - 1.2 mg/dL   GFR calc non Af Amer >60 >60 mL/min   GFR calc Af Amer >60 >60 mL/min   Anion gap 11 5 - 15    Comment: Performed at [facility] [address] [place], [place] [address]  Lipase, blood     Status: None   Collection Time: 06/03/19  6:17 AM  Result Value Ref Range   Lipase 19 11 - 51 U/L    Comment: Performed at [facility] [address] [place], [place] [address]   US Abdomen Limited  Result Date: 06/03/2019 CLINICAL DATA:  Right upper quadrant pain. EXAM: ULTRASOUND ABDOMEN LIMITED RIGHT UPPER QUADRANT COMPARISON:  Abdominal ultrasound 04/25/2019. CTA abdomen 12/13/2018 FINDINGS: Gallbladder: Mild distention of the gallbladder. 1.1 cm echogenic stone at the base of the gallbladder. No significant gallbladder wall thickening. Negative for sonographic Murphy sign. Common bile duct: Diameter: 0.5 cm Liver: No focal abnormality. Liver is mildly heterogeneous. Portal vein is patent on color Doppler imaging with normal direction of blood flow towards the liver. Other: Incidental imaging of the right kidney again demonstrates multiple parapelvic cysts. IMPRESSION: Cholelithiasis without evidence for acute cholecystitis. No biliary dilatation. Electronically Signed   By: Markus Daft M.D.   On: 06/03/2019 08:32       Assessment/Plan HTN GERD Hypothyroidism HLD  Symptomatic cholelithiasis Leukocytosis Possible acute cholecystitis -  Will admit to med surg and plan for laparoscopic cholecystectomy today. Keep NPO. Start IV rocephin. Will discuss with OR if he needs to be tested again for covid 19 or if the negative result from yesterday is adequate.    Wellington Hampshire, [facility] Surgery 06/03/2019, 9:46 AM Pager: [phone] Mon-Thurs 7:00 am-4:30 pm Fri 7:00 am -11:30 AM Sat-Sun 7:00 am-11:30 am

## 2019-06-03 NOTE — Care Management (Signed)
ED CM consulted for [facility], CM reviewed record and patient was seen by Surgery today and is on call for surgery today.  CM met with patient and wife at bedside. CM will wait transitional care planning until after surgical procedure.

## 2019-06-03 NOTE — Discharge Instructions (Signed)
[facility] ______[facility] °LAPAROSCOPIC SURGERY: POST OP INSTRUCTIONS °Always review your discharge instruction sheet given to you by the facility where your surgery was performed. °IF YOU HAVE DISABILITY OR FAMILY LEAVE FORMS, YOU MUST BRING THEM TO THE OFFICE FOR PROCESSING.   °DO NOT GIVE THEM TO YOUR DOCTOR. ° °1. A prescription for pain medication may be given to you upon discharge.  Take your pain medication as prescribed, if needed.  If narcotic pain medicine is not needed, then you may take acetaminophen (Tylenol) or ibuprofen (Advil) as needed. °2. Take your usually prescribed medications unless otherwise directed. °3. If you need a refill on your pain medication, please contact your pharmacy.  They will contact our office to request authorization. Prescriptions will not be filled after 5pm or on week-ends. °4. You should follow a light diet the first few days after arrival home, such as soup and crackers, etc.  Be sure to include lots of fluids daily. °5. Most patients will experience some swelling and bruising in the area of the incisions.  Ice packs will help.  Swelling and bruising can take several days to resolve.  °6. It is common to experience some constipation if taking pain medication after surgery.  Increasing fluid intake and taking a stool softener (such as Colace) will usually help or prevent this problem from occurring.  A mild laxative (Milk of Magnesia or Miralax) should be taken according to package instructions if there are no bowel movements after 48 hours. °7. Unless discharge instructions indicate otherwise, you may remove your bandages 24-48 hours after surgery, and you may shower at that time.  You may have steri-strips (small skin tapes) in place directly over the incision.  These strips should be left on the skin for 7-10 days.  If your surgeon used skin glue on the incision, you may shower in 24 hours.  The glue will flake off over the next 2-3 weeks.  Any sutures or  staples will be removed at the office during your follow-up visit. °8. ACTIVITIES:  You may resume regular (light) daily activities beginning the next day--such as daily self-care, walking, climbing stairs--gradually increasing activities as tolerated.  You may have sexual intercourse when it is comfortable.  Refrain from any heavy lifting or straining until approved by your doctor. °a. You may drive when you are no longer taking prescription pain medication, you can comfortably wear a seatbelt, and you can safely maneuver your car and apply brakes. °b. RETURN TO WORK:  __________________________________________________________ °9. You should see your doctor in the office for a follow-up appointment approximately 2-3 weeks after your surgery.  Make sure that you call for this appointment within a day or two after you arrive home to insure a convenient appointment time. °10. OTHER INSTRUCTIONS: __________________________________________________________________________________________________________________________ __________________________________________________________________________________________________________________________ °WHEN TO CALL YOUR DOCTOR: °1. Fever over 101.0 °2. Inability to urinate °3. Continued bleeding from incision. °4. Increased pain, redness, or drainage from the incision. °5. Increasing abdominal pain ° °The clinic staff is available to answer your questions during regular business hours.  Please don’t hesitate to call and ask to speak to one of the nurses for clinical concerns.  If you have a medical emergency, go to the nearest emergency room or call 911.  A surgeon from [facility] is always on call at the hospital. °[address], [address], [place], [place]  [address] ? [address] [address], [place], [place]   [address] °[phone] ? [phone] ? FAX [phone] °Web site:   [email or web address] °

## 2019-06-04 ENCOUNTER — Encounter (HOSPITAL_COMMUNITY): Payer: Self-pay | Admitting: Surgery

## 2019-06-04 DIAGNOSIS — R1310 Dysphagia, unspecified: Secondary | ICD-10-CM | POA: Diagnosis not present

## 2019-06-04 DIAGNOSIS — Z0000 Encounter for general adult medical examination without abnormal findings: Secondary | ICD-10-CM | POA: Diagnosis not present

## 2019-06-04 DIAGNOSIS — Z125 Encounter for screening for malignant neoplasm of prostate: Secondary | ICD-10-CM | POA: Diagnosis not present

## 2019-06-04 DIAGNOSIS — E782 Mixed hyperlipidemia: Secondary | ICD-10-CM | POA: Diagnosis not present

## 2019-06-04 DIAGNOSIS — Z1389 Encounter for screening for other disorder: Secondary | ICD-10-CM | POA: Diagnosis not present

## 2019-06-04 DIAGNOSIS — I10 Essential (primary) hypertension: Secondary | ICD-10-CM | POA: Diagnosis not present

## 2019-06-04 DIAGNOSIS — E039 Hypothyroidism, unspecified: Secondary | ICD-10-CM | POA: Diagnosis not present

## 2019-06-04 DIAGNOSIS — E042 Nontoxic multinodular goiter: Secondary | ICD-10-CM | POA: Diagnosis not present

## 2019-06-04 DIAGNOSIS — Z9889 Other specified postprocedural states: Secondary | ICD-10-CM | POA: Diagnosis not present

## 2019-06-04 NOTE — Anesthesia Postprocedure Evaluation (Signed)
Anesthesia Post Note  Patient: Mark Ewing  Procedure(s) Performed: LAPAROSCOPIC CHOLECYSTECTOMY WITH INTRAOPERATIVE CHOLANGIOGRAM (N/A Abdomen)     Patient location during evaluation: PACU Anesthesia Type: General Level of consciousness: awake and alert Pain management: pain level controlled Vital Signs Assessment: post-procedure vital signs reviewed and stable Respiratory status: spontaneous breathing, nonlabored ventilation, respiratory function stable and patient connected to nasal cannula oxygen Cardiovascular status: blood pressure returned to baseline and stable Postop Assessment: no apparent nausea or vomiting Anesthetic complications: no    Last Vitals:  Vitals:   06/03/19 1548 06/03/19 1608  BP:    Pulse: 75 77  Resp: 10   Temp: 36.8 C   SpO2: 93% 98%    Last Pain:  Vitals:   06/03/19 1536  TempSrc:   PainSc: 0-No pain                 Chabeli Barsamian DAVID

## 2019-06-06 NOTE — Discharge Summary (Addendum)
  [facility] Discharge Summary   Patient ID: Mark Ewing MRN: [id number] DOB/AGE: 77-23-1942 77 y.o.  Admit date: 06/03/2019 Discharge date: 06/03/2019  Admitting Diagnosis: Acute cholecystitis  Discharge Diagnosis Patient Active Problem List   Diagnosis Date Noted  . Symptomatic cholelithiasis 06/03/2019    Consultants None Imaging: No results found.  Procedures Dr. Georgette Dover (06/03/19) - Laparoscopic Cholecystectomy with [facility]  Hospital Course:  Mark Ewing is a 78yo male who was scheduled for an elective laparoscopic cholecystectomy 8/19 with Dr. Georgette Dover, but presented to the [facility] 8/16 with worsening abdominal pain. U/s showed cholelithiasis without evidence for acute cholecystitis, no biliary dilatation. Leukocytosis with WBC 14.3. Patient was admitted and underwent procedure listed above.  Intraoperatively he was found to have calculus of gallbladder with other cholecystitis and without mention of obstruction. Tolerated procedure well.  On POD#0 the patient was voiding well, tolerating diet, ambulating well, pain well controlled, vital signs stable, incisions c/d/i and felt stable for discharge home.  Patient will follow up as below and knows to call with questions or concerns.      Allergies as of 06/03/2019   No Known Allergies     Medication List    TAKE these medications   acetaminophen 500 MG tablet Commonly known as: TYLENOL Take 500 mg by mouth every 6 (six) hours as needed (pain.).   aspirin 81 MG tablet Take 81 mg by mouth daily.   co-enzyme Q-10 50 MG capsule Take 100 mg by mouth daily.   famotidine 20 MG tablet Commonly known as: PEPCID Take 20 mg by mouth 2 (two) times daily.   ibuprofen 200 MG tablet Commonly known as: ADVIL Take 400 mg by mouth 2 (two) times daily as needed (for pain).   levothyroxine 88 MCG tablet Commonly known as: SYNTHROID Take 88 mcg by mouth daily before breakfast.   losartan 50 MG tablet Commonly  known as: COZAAR Take 50 mg by mouth daily.   oxyCODONE 5 MG immediate release tablet Commonly known as: Oxy IR/ROXICODONE Take 1 tablet (5 mg total) by mouth every 6 (six) hours as needed for moderate pain, severe pain or breakthrough pain.   simvastatin 10 MG tablet Commonly known as: ZOCOR Take 10 mg by mouth daily.   Turmeric 500 MG Caps Take 500 mg by mouth daily.   verapamil 180 MG CR tablet Commonly known as: CALAN-SR Take 180 mg by mouth 2 (two) times daily.        Follow-up Information    Donnie Mesa, MD. Schedule an appointment as soon as possible for a visit in 3 weeks.   Specialty: General Surgery Contact information: [place] [phone]           Signed: Wellington Hampshire, [facility] 06/06/2019, 2:15 PM Pager: [phone] Mon-Thurs 7:00 am-4:30 pm Fri 7:00 am -11:30 AM Sat-Sun 7:00 am-11:30 am

## 2019-06-12 DIAGNOSIS — E039 Hypothyroidism, unspecified: Secondary | ICD-10-CM | POA: Diagnosis not present

## 2019-06-12 DIAGNOSIS — E782 Mixed hyperlipidemia: Secondary | ICD-10-CM | POA: Diagnosis not present

## 2019-07-23 ENCOUNTER — Ambulatory Visit
Admission: RE | Admit: 2019-07-23 | Discharge: 2019-07-23 | Disposition: A | Discharge status: Home / Self Care | Payer: Medicare HMO | Source: Ambulatory Visit | Attending: Family Medicine | Admitting: Family Medicine

## 2019-07-23 ENCOUNTER — Other Ambulatory Visit: Payer: Self-pay | Admitting: Family Medicine

## 2019-07-23 DIAGNOSIS — K5900 Constipation, unspecified: Secondary | ICD-10-CM

## 2019-07-23 DIAGNOSIS — R109 Unspecified abdominal pain: Secondary | ICD-10-CM | POA: Diagnosis not present

## 2019-07-23 DIAGNOSIS — M62830 Muscle spasm of back: Secondary | ICD-10-CM | POA: Diagnosis not present

## 2019-07-27 IMAGING — CT CT CTA ABD/PEL W/CM AND/OR W/O CM
2 of 9 series · 11 of 46 positions shown, 17 images · IV contrast (APPLIED)
Comparison: 11/23/2015

CLINICAL DATA: Assess for mesenteric ischemia.  Dizziness today.

EXAM:
CTA ABDOMEN AND PELVIS wITHOUT AND WITH CONTRAST
TECHNIQUE: Multidetector CT imaging of the abdomen and pelvis was performed
using the standard protocol during bolus administration of
intravenous contrast. Multiplanar reconstructed images and MIPs were
obtained and reviewed to evaluate the vascular anatomy.
CONTRAST:  100mL PFCW0S-YAH IOPAMIDOL (PFCW0S-YAH) INJECTION 76%

[Series 9: venous 5.0 i30f 1 · axial · portal-venous · 0.79mm/px · z∈[+816,+1211]mm · 9 of 99 slices shown, 15 images]
[im 10/99  soft-tissue]
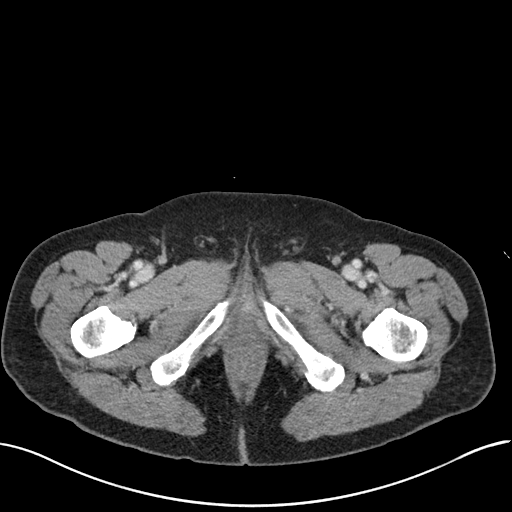
[im 10/99  bone]
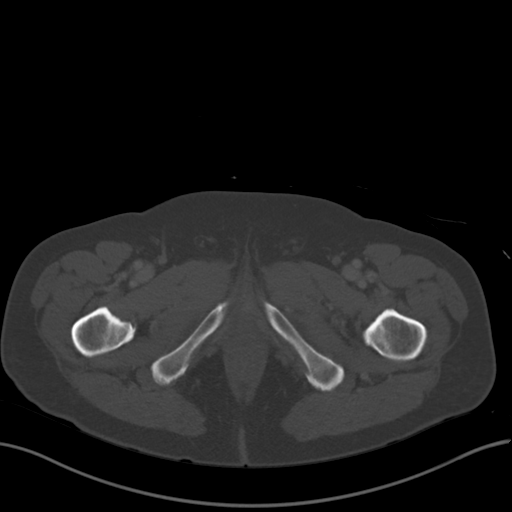
[im 20/99  soft-tissue]
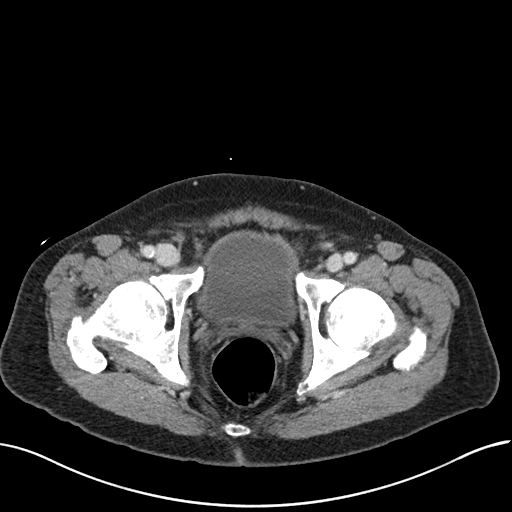
[im 30/99  soft-tissue]
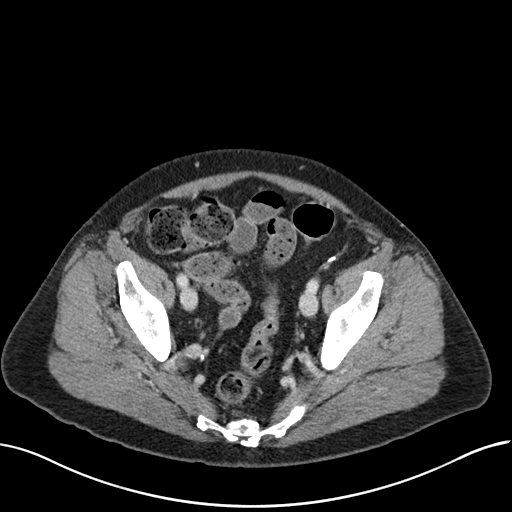
[im 40/99  soft-tissue]
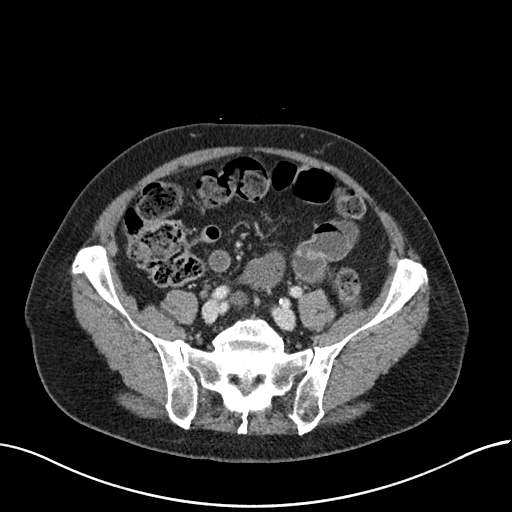
[im 50/99  soft-tissue]
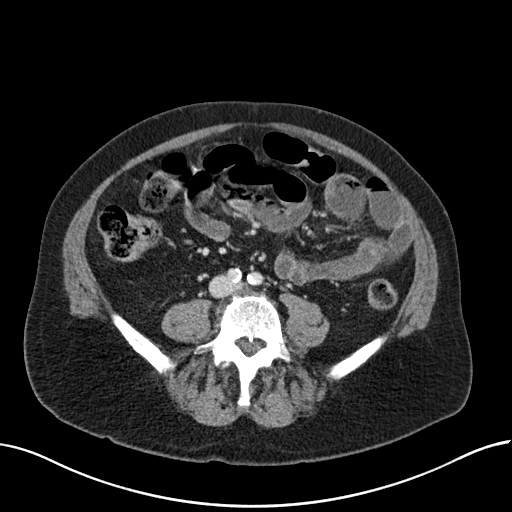
[im 59/99  soft-tissue]
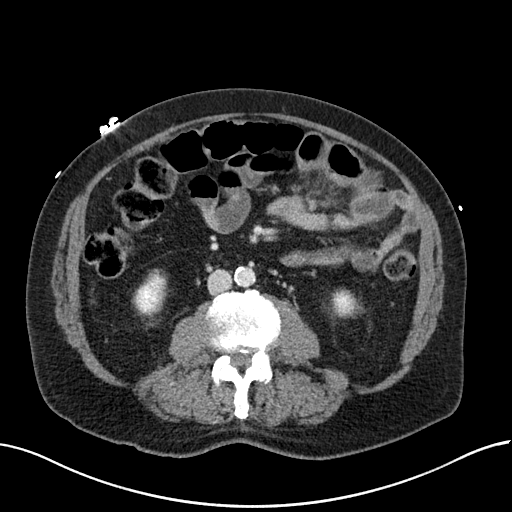
[im 59/99  lung]
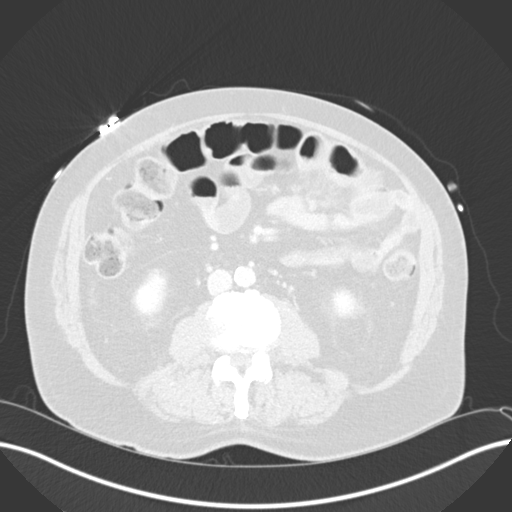
[im 69/99  soft-tissue]
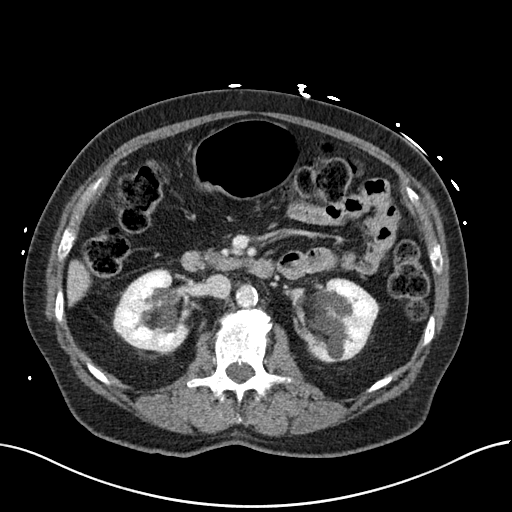
[im 69/99  lung]
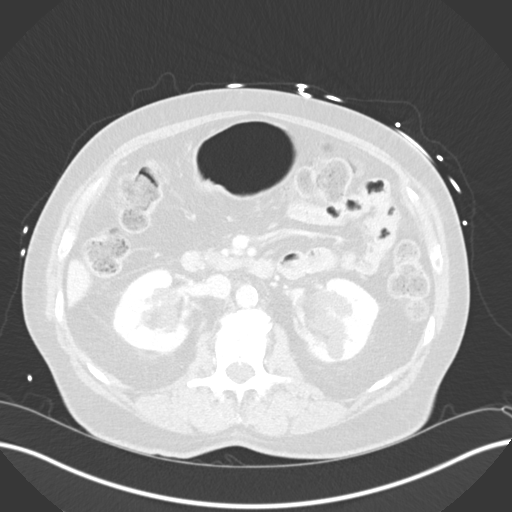
[im 79/99  soft-tissue]
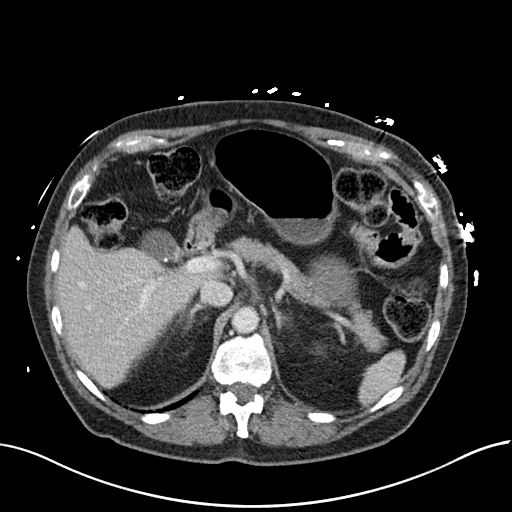
[im 79/99  lung]
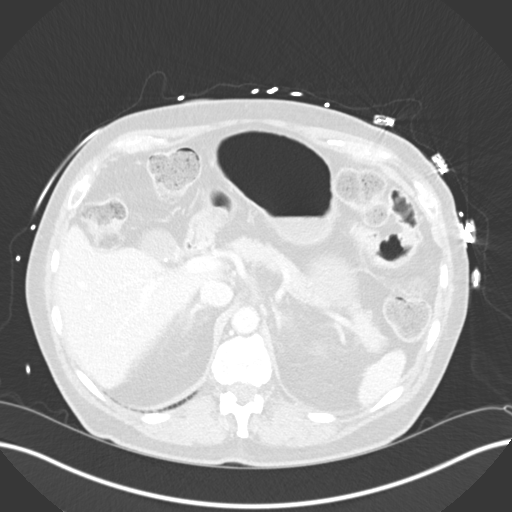
[im 89/99  soft-tissue]
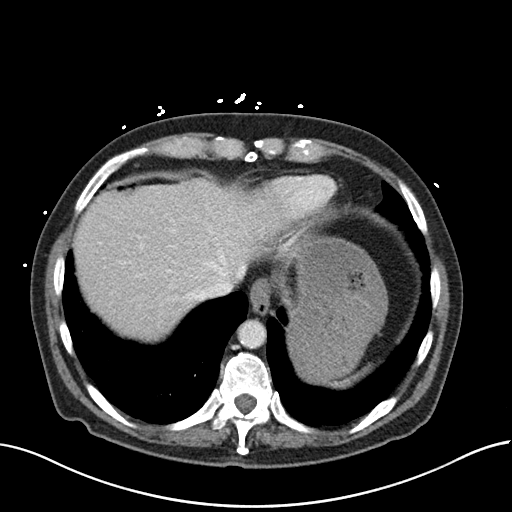
[im 89/99  lung]
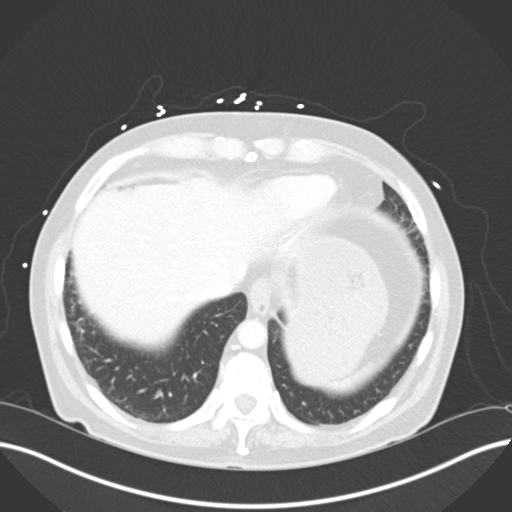
[im 89/99  bone]
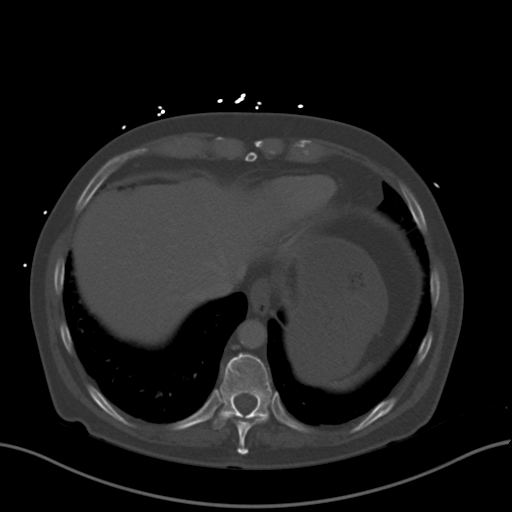

[Series 11: coronals · coronal · 0.73mm/px · 2 of 164 slices shown]
[im 55/164  soft-tissue]
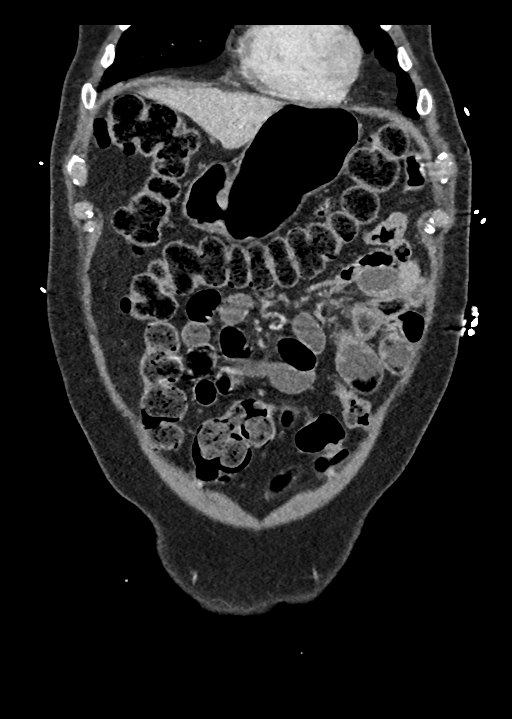
[im 109/164  soft-tissue]
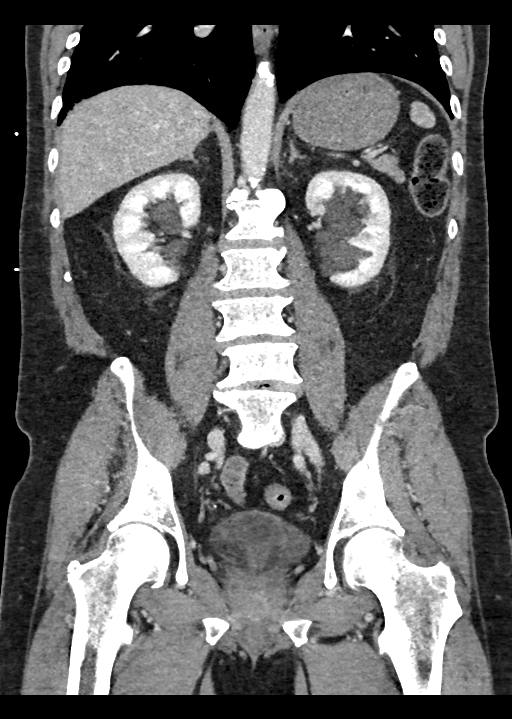

[11 of 46 positions shown; findings below may reference images not displayed]

FINDINGS: VASCULAR

Aorta: Nonaneurysmal and patent with atherosclerotic calcifications.

Celiac: Patent.  Branch vessels patent.

SMA: Patent.  Branch vessels are grossly patent.

Renals: 2 right and 2 left renal arteries are patent.

IMA: Patent.  Branch vessels are grossly patent.

Inflow: Bilateral common, internal, and external iliac arteries are
patent with scattered atherosclerotic calcifications.

Proximal Outflow: Grossly patent.

Veins: No evidence of DVT.

Review of the MIP images confirms the above findings.

NON-VASCULAR

Lower chest: Dependent atelectasis at the right lung base.

Hepatobiliary: Gallstones are noted. Liver is within normal limits
in appearance.

Pancreas: Unremarkable

Spleen: Unremarkable

Adrenals/Urinary Tract: Several pelvic cysts are noted. Left adrenal
nodule is stable supporting benign etiology. Unremarkable bladder.

Stomach/Bowel: Normal appendix. No obvious mass in the colon. Mildly
distended small bowel loops in the left hemiabdomen are noted
without transition point. There is subtle small bowel wall
thickening in the left hemiabdomen associated with stranding in the
mesenteric fat. See image 43. There is no free intraperitoneal gas
or abscess formation.

Lymphatic: There is no abnormal retroperitoneal adenopathy.

Reproductive: Normal prostate.

Other: No free fluid or free intraperitoneal gas.

Musculoskeletal: No vertebral compression deformity.
IMPRESSION: VASCULAR

No significant narrowing involving the visceral vasculature to
result in mesenteric ischemia.

NON-VASCULAR

There is small bowel wall thickening in the left hemiabdomen
associated with stranding in the adjacent fat. Findings are most
consistent with an inflammatory process. There is associated mild
small-bowel distention which may be related to a focal ileus. There
is no free intraperitoneal gas or abscess.

Cholelithiasis.

## 2019-09-12 DIAGNOSIS — M1611 Unilateral primary osteoarthritis, right hip: Secondary | ICD-10-CM | POA: Diagnosis not present

## 2019-09-12 DIAGNOSIS — I10 Essential (primary) hypertension: Secondary | ICD-10-CM | POA: Diagnosis not present

## 2019-09-12 DIAGNOSIS — E039 Hypothyroidism, unspecified: Secondary | ICD-10-CM | POA: Diagnosis not present

## 2019-09-12 DIAGNOSIS — E782 Mixed hyperlipidemia: Secondary | ICD-10-CM | POA: Diagnosis not present

## 2019-11-14 ENCOUNTER — Ambulatory Visit: Payer: Medicare HMO

## 2019-11-23 ENCOUNTER — Ambulatory Visit: Payer: Medicare HMO

## 2019-12-04 DIAGNOSIS — E039 Hypothyroidism, unspecified: Secondary | ICD-10-CM | POA: Diagnosis not present

## 2019-12-04 DIAGNOSIS — E782 Mixed hyperlipidemia: Secondary | ICD-10-CM | POA: Diagnosis not present

## 2019-12-04 DIAGNOSIS — I10 Essential (primary) hypertension: Secondary | ICD-10-CM | POA: Diagnosis not present

## 2019-12-04 DIAGNOSIS — R1310 Dysphagia, unspecified: Secondary | ICD-10-CM | POA: Diagnosis not present

## 2019-12-04 DIAGNOSIS — E042 Nontoxic multinodular goiter: Secondary | ICD-10-CM | POA: Diagnosis not present

## 2019-12-04 DIAGNOSIS — K5900 Constipation, unspecified: Secondary | ICD-10-CM | POA: Diagnosis not present

## 2019-12-05 DIAGNOSIS — M1611 Unilateral primary osteoarthritis, right hip: Secondary | ICD-10-CM | POA: Diagnosis not present

## 2019-12-05 DIAGNOSIS — I10 Essential (primary) hypertension: Secondary | ICD-10-CM | POA: Diagnosis not present

## 2019-12-05 DIAGNOSIS — E039 Hypothyroidism, unspecified: Secondary | ICD-10-CM | POA: Diagnosis not present

## 2019-12-05 DIAGNOSIS — E782 Mixed hyperlipidemia: Secondary | ICD-10-CM | POA: Diagnosis not present

## 2019-12-12 DIAGNOSIS — H524 Presbyopia: Secondary | ICD-10-CM | POA: Diagnosis not present

## 2020-01-16 DIAGNOSIS — E039 Hypothyroidism, unspecified: Secondary | ICD-10-CM | POA: Diagnosis not present

## 2020-01-17 DIAGNOSIS — M62838 Other muscle spasm: Secondary | ICD-10-CM | POA: Diagnosis not present

## 2020-01-17 DIAGNOSIS — E039 Hypothyroidism, unspecified: Secondary | ICD-10-CM | POA: Diagnosis not present

## 2020-02-29 DIAGNOSIS — R1013 Epigastric pain: Secondary | ICD-10-CM | POA: Diagnosis not present

## 2020-02-29 DIAGNOSIS — K5900 Constipation, unspecified: Secondary | ICD-10-CM | POA: Diagnosis not present

## 2020-02-29 DIAGNOSIS — K2970 Gastritis, unspecified, without bleeding: Secondary | ICD-10-CM | POA: Diagnosis not present

## 2020-02-29 DIAGNOSIS — R110 Nausea: Secondary | ICD-10-CM | POA: Diagnosis not present

## 2020-03-07 DIAGNOSIS — H40013 Open angle with borderline findings, low risk, bilateral: Secondary | ICD-10-CM | POA: Diagnosis not present

## 2020-03-11 DIAGNOSIS — E039 Hypothyroidism, unspecified: Secondary | ICD-10-CM | POA: Diagnosis not present

## 2020-03-11 DIAGNOSIS — E782 Mixed hyperlipidemia: Secondary | ICD-10-CM | POA: Diagnosis not present

## 2020-03-11 DIAGNOSIS — M1611 Unilateral primary osteoarthritis, right hip: Secondary | ICD-10-CM | POA: Diagnosis not present

## 2020-03-11 DIAGNOSIS — I10 Essential (primary) hypertension: Secondary | ICD-10-CM | POA: Diagnosis not present

## 2020-04-08 DIAGNOSIS — M1611 Unilateral primary osteoarthritis, right hip: Secondary | ICD-10-CM | POA: Diagnosis not present

## 2020-04-08 DIAGNOSIS — E782 Mixed hyperlipidemia: Secondary | ICD-10-CM | POA: Diagnosis not present

## 2020-04-08 DIAGNOSIS — E039 Hypothyroidism, unspecified: Secondary | ICD-10-CM | POA: Diagnosis not present

## 2020-04-08 DIAGNOSIS — I10 Essential (primary) hypertension: Secondary | ICD-10-CM | POA: Diagnosis not present

## 2020-04-16 ENCOUNTER — Other Ambulatory Visit: Payer: Self-pay | Admitting: Gastroenterology

## 2020-04-16 DIAGNOSIS — R1013 Epigastric pain: Secondary | ICD-10-CM | POA: Diagnosis not present

## 2020-04-17 ENCOUNTER — Ambulatory Visit
Admission: RE | Admit: 2020-04-17 | Discharge: 2020-04-17 | Disposition: A | Discharge status: Home / Self Care | Payer: Medicare HMO | Source: Ambulatory Visit | Attending: Gastroenterology | Admitting: Gastroenterology

## 2020-04-17 DIAGNOSIS — K7689 Other specified diseases of liver: Secondary | ICD-10-CM | POA: Diagnosis not present

## 2020-04-17 DIAGNOSIS — R1013 Epigastric pain: Secondary | ICD-10-CM

## 2020-04-17 DIAGNOSIS — Q600 Renal agenesis, unilateral: Secondary | ICD-10-CM | POA: Diagnosis not present

## 2020-04-17 DIAGNOSIS — N281 Cyst of kidney, acquired: Secondary | ICD-10-CM | POA: Diagnosis not present

## 2020-04-17 DIAGNOSIS — I700 Atherosclerosis of aorta: Secondary | ICD-10-CM | POA: Diagnosis not present

## 2020-05-14 DIAGNOSIS — E039 Hypothyroidism, unspecified: Secondary | ICD-10-CM | POA: Diagnosis not present

## 2020-05-14 DIAGNOSIS — E782 Mixed hyperlipidemia: Secondary | ICD-10-CM | POA: Diagnosis not present

## 2020-05-14 DIAGNOSIS — I10 Essential (primary) hypertension: Secondary | ICD-10-CM | POA: Diagnosis not present

## 2020-05-14 DIAGNOSIS — M1611 Unilateral primary osteoarthritis, right hip: Secondary | ICD-10-CM | POA: Diagnosis not present

## 2020-05-27 DIAGNOSIS — K2970 Gastritis, unspecified, without bleeding: Secondary | ICD-10-CM | POA: Diagnosis not present

## 2020-06-06 DIAGNOSIS — I10 Essential (primary) hypertension: Secondary | ICD-10-CM | POA: Diagnosis not present

## 2020-06-06 DIAGNOSIS — K5900 Constipation, unspecified: Secondary | ICD-10-CM | POA: Diagnosis not present

## 2020-06-06 DIAGNOSIS — I700 Atherosclerosis of aorta: Secondary | ICD-10-CM | POA: Diagnosis not present

## 2020-06-06 DIAGNOSIS — E039 Hypothyroidism, unspecified: Secondary | ICD-10-CM | POA: Diagnosis not present

## 2020-06-06 DIAGNOSIS — R1310 Dysphagia, unspecified: Secondary | ICD-10-CM | POA: Diagnosis not present

## 2020-06-06 DIAGNOSIS — Z0000 Encounter for general adult medical examination without abnormal findings: Secondary | ICD-10-CM | POA: Diagnosis not present

## 2020-06-06 DIAGNOSIS — Z1211 Encounter for screening for malignant neoplasm of colon: Secondary | ICD-10-CM | POA: Diagnosis not present

## 2020-06-06 DIAGNOSIS — E042 Nontoxic multinodular goiter: Secondary | ICD-10-CM | POA: Diagnosis not present

## 2020-06-06 DIAGNOSIS — E782 Mixed hyperlipidemia: Secondary | ICD-10-CM | POA: Diagnosis not present

## 2020-07-07 DIAGNOSIS — H903 Sensorineural hearing loss, bilateral: Secondary | ICD-10-CM | POA: Diagnosis not present

## 2020-07-23 ENCOUNTER — Encounter (HOSPITAL_BASED_OUTPATIENT_CLINIC_OR_DEPARTMENT_OTHER): Payer: Self-pay | Admitting: *Deleted

## 2020-07-23 ENCOUNTER — Other Ambulatory Visit: Payer: Self-pay

## 2020-07-23 ENCOUNTER — Emergency Department (HOSPITAL_BASED_OUTPATIENT_CLINIC_OR_DEPARTMENT_OTHER)
Admission: EM | Admit: 2020-07-23 | Discharge: 2020-07-24 | Disposition: A | Discharge status: Home / Self Care | Payer: Medicare HMO | Attending: Emergency Medicine | Admitting: Emergency Medicine

## 2020-07-23 DIAGNOSIS — Z79899 Other long term (current) drug therapy: Secondary | ICD-10-CM | POA: Diagnosis not present

## 2020-07-23 DIAGNOSIS — Z7982 Long term (current) use of aspirin: Secondary | ICD-10-CM | POA: Insufficient documentation

## 2020-07-23 DIAGNOSIS — R040 Epistaxis: Secondary | ICD-10-CM

## 2020-07-23 DIAGNOSIS — I10 Essential (primary) hypertension: Secondary | ICD-10-CM | POA: Insufficient documentation

## 2020-07-23 DIAGNOSIS — E039 Hypothyroidism, unspecified: Secondary | ICD-10-CM | POA: Insufficient documentation

## 2020-07-23 MED ORDER — LIDOCAINE HCL 4 % EX SOLN
Freq: Once | CUTANEOUS | Status: AC
  Filled 2020-07-23: qty 50

## 2020-07-23 MED ORDER — OXYMETAZOLINE HCL 0.05 % NA SOLN
1.0000 | Freq: Once | NASAL | Status: AC
  Administered 2020-07-23: 1 via NASAL
  Filled 2020-07-23: qty 30

## 2020-07-23 MED ORDER — OXYMETAZOLINE HCL 0.05 % NA SOLN
NASAL | Status: AC
  Filled 2020-07-23: qty 30

## 2020-07-23 MED ORDER — SILVER NITRATE-POT NITRATE 75-25 % EX MISC
5.0000 | Freq: Once | CUTANEOUS | Status: AC
  Administered 2020-07-23: 5 via TOPICAL
  Filled 2020-07-23: qty 10

## 2020-07-23 NOTE — ED Triage Notes (Signed)
Pt states he was at dinner about 1930 and had onset of left nose bleed. Not on blood thinners, small amount of bleeding in triage.

## 2020-07-23 NOTE — ED Notes (Signed)
Bleeding from nose stopped.

## 2020-07-23 NOTE — ED Provider Notes (Signed)
[facility] EMERGENCY DEPARTMENT Provider Note   CSN: [id number] Arrival date & time: 07/23/20  2013     History Chief Complaint  Patient presents with  . Epistaxis    Mark Ewing is a 79 y.o. male.  Suddenly started bleeding from left side of nose at 7:30PM. Could not get bleeding controlled. No trauma, not on blood thinners. No history of bleeding. Noticed a small bump in nose that seemed to be the source of bleeding.        Past Medical History:  Diagnosis Date  . 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 Carotid artery stenosis    8/54/62 [place]: 70-35% LICA stenosis by velocities, CTA neck rec that showed no sign LICA stenosis  . GERD (gastroesophageal reflux disease)   . Hyperlipidemia   . Hypertension   . Hypothyroidism   . Inguinal hernia   . Symptomatic cholelithiasis   . Thyroid disease   . Thyroid nodule     Patient Active Problem List   Diagnosis Date Noted  . Symptomatic cholelithiasis 06/03/2019    Past Surgical History:  Procedure Laterality Date  . CATARACT EXTRACTION W/ INTRAOCULAR LENS  IMPLANT, BILATERAL    . CHOLECYSTECTOMY N/A 06/03/2019   Procedure: LAPAROSCOPIC CHOLECYSTECTOMY WITH INTRAOPERATIVE CHOLANGIOGRAM;  Surgeon: Donnie Mesa, MD;  Location: [place];  Service: General;  Laterality: N/A;  . HERNIA REPAIR    . TONSILLECTOMY         Family History  Problem Relation Age of Onset  . Hypertension Mother   . Ovarian cancer Mother   . Hypertension Father   . CVA Father   . Prostate cancer Father   . Hypertension Brother   . Hypertension Paternal Grandfather   . CVA Paternal Grandfather     Social History   Tobacco Use  . Smoking status: Never Smoker  . Smokeless tobacco: Never Used  Vaping Use  . Vaping Use: Never used  Substance Use Topics  . Alcohol use: No  . Drug use: No    Home Medications Prior to Admission medications   Medication Sig Start Date End Date  Taking? Authorizing Provider  acetaminophen (TYLENOL) 500 MG tablet Take 500 mg by mouth every 6 (six) hours as needed (pain.).    [provider]  aspirin 81 MG tablet Take 81 mg by mouth daily.    [provider]  co-enzyme Q-10 50 MG capsule Take 100 mg by mouth daily.    [provider]  famotidine (PEPCID) 20 MG tablet Take 20 mg by mouth 2 (two) times daily.     [provider]  ibuprofen (ADVIL) 200 MG tablet Take 400 mg by mouth 2 (two) times daily as needed (for pain).     [provider]  levothyroxine (SYNTHROID, LEVOTHROID) 88 MCG tablet Take 88 mcg by mouth daily before breakfast.     [provider]  losartan (COZAAR) 50 MG tablet Take 50 mg by mouth daily.  12/08/18   [provider]  oxyCODONE (OXY IR/ROXICODONE) 5 MG immediate release tablet Take 1 tablet (5 mg total) by mouth every 6 (six) hours as needed for moderate pain, severe pain or breakthrough pain. 06/03/19   Donnie Mesa, MD  simvastatin (ZOCOR) 10 MG tablet Take 10 mg by mouth daily.    [provider]  Turmeric 500 MG CAPS Take 500 mg by mouth daily.    [provider]  verapamil (CALAN-SR)  180 MG CR tablet Take 180 mg by mouth 2 (two) times daily.     [provider]    Allergies    Patient has no known allergies.  Review of Systems   Review of Systems  HENT: Positive for nosebleeds.   All other systems reviewed and are negative.   Physical Exam Updated Vital Signs BP 132/70 (BP Location: Right Arm)   Pulse 74   Temp 97.9 F (36.6 C) (Oral)   Resp 16   SpO2 99%   Physical Exam Vitals and nursing note reviewed.  Constitutional:      General: He is not in acute distress.    Appearance: Normal appearance. He is well-developed.  HENT:     Head: Normocephalic and atraumatic.     Right Ear: Hearing normal.     Left Ear: Hearing normal.     Nose: Nose normal.     Comments: Blood in left nare 3-72mm raised purple  lesion on septum with small amount of oozing blood. Eyes:     Conjunctiva/sclera: Conjunctivae normal.     Pupils: Pupils are equal, round, and reactive to light.  Cardiovascular:     Rate and Rhythm: Regular rhythm.     Heart sounds: S1 normal and S2 normal. No murmur heard.  No friction rub. No gallop.   Pulmonary:     Effort: Pulmonary effort is normal. No respiratory distress.     Breath sounds: Normal breath sounds.  Chest:     Chest wall: No tenderness.  Abdominal:     General: Bowel sounds are normal.     Palpations: Abdomen is soft.     Tenderness: There is no abdominal tenderness. There is no guarding or rebound. Negative signs include Murphy's sign and McBurney's sign.     Hernia: No hernia is present.  Musculoskeletal:        General: Normal range of motion.     Cervical back: Normal range of motion and neck supple.  Skin:    General: Skin is warm and dry.     Findings: No rash.  Neurological:     Mental Status: He is alert and oriented to person, place, and time.     GCS: GCS eye subscore is 4. GCS verbal subscore is 5. GCS motor subscore is 6.     Cranial Nerves: No cranial nerve deficit.     Sensory: No sensory deficit.     Coordination: Coordination normal.  Psychiatric:        Speech: Speech normal.        Behavior: Behavior normal.        Thought Content: Thought content normal.     ED Results / Procedures / Treatments   Labs (all labs ordered are listed, but only abnormal results are displayed) Labs Reviewed - No data to display  EKG None  Radiology No results found.  Procedures .Epistaxis Management  Date/Time: 07/24/2020 1:38 AM Performed by: Orpah Greek, MD Authorized by: Orpah Greek, MD   Consent:    Consent obtained:  Verbal   Consent given by:  Patient   Risks discussed:  Bleeding and pain Universal protocol:    Procedure explained and questions answered to patient or proxy's satisfaction: yes     Site/side  marked: yes     Time out called: yes     Patient identity confirmed:  Verbally with patient Anesthesia (see MAR for exact dosages):    Anesthesia method:  Topical application   Topical  anesthetic:  Lidocaine gel Procedure details:    Treatment site:  L anterior   Treatment method:  Silver nitrate   Treatment complexity:  Limited   Treatment episode: initial   Post-procedure details:    Assessment:  Bleeding stopped   Patient tolerance of procedure:  Tolerated well, no immediate complications   (including critical care time)  Medications Ordered in ED Medications  oxymetazoline (AFRIN) 0.05 % nasal spray 1 spray (1 spray Each Nare Given 07/23/20 2024)  lidocaine (XYLOCAINE) 4 % external solution ( Topical Given 07/23/20 2324)  silver nitrate applicators applicator 5 Stick (5 Sticks Topical Given 07/23/20 2324)    ED Course  I have reviewed the triage vital signs and the nursing notes.  Pertinent labs & imaging results that were available during my care of the patient were reviewed by me and considered in my medical decision making (see chart for details).    MDM Rules/Calculators/A&P                          Patient presents to the emergency department for evaluation of bleeding from the left side of his nose.  He denies trauma.  Patient does have a small area of what appears to be abnormal surface vessel that might be the source of bleeding.  Area was cauterized with silver nitrate and patient monitored, no further bleeding.  He has an ENT doctor that he will call in the morning for follow-up.  Final Clinical Impression(s) / ED Diagnoses Final diagnoses:  Left-sided epistaxis    Rx / DC Orders ED Discharge Orders    None       Orpah Greek, MD 07/24/20 0139

## 2020-08-06 DIAGNOSIS — R040 Epistaxis: Secondary | ICD-10-CM | POA: Diagnosis not present

## 2020-09-08 DIAGNOSIS — H40013 Open angle with borderline findings, low risk, bilateral: Secondary | ICD-10-CM | POA: Diagnosis not present

## 2020-09-18 DIAGNOSIS — Z20828 Contact with and (suspected) exposure to other viral communicable diseases: Secondary | ICD-10-CM | POA: Diagnosis not present

## 2020-10-21 DIAGNOSIS — H40013 Open angle with borderline findings, low risk, bilateral: Secondary | ICD-10-CM | POA: Diagnosis not present

## 2020-12-02 DIAGNOSIS — K219 Gastro-esophageal reflux disease without esophagitis: Secondary | ICD-10-CM | POA: Diagnosis not present

## 2020-12-02 DIAGNOSIS — M1611 Unilateral primary osteoarthritis, right hip: Secondary | ICD-10-CM | POA: Diagnosis not present

## 2020-12-02 DIAGNOSIS — E039 Hypothyroidism, unspecified: Secondary | ICD-10-CM | POA: Diagnosis not present

## 2020-12-02 DIAGNOSIS — E782 Mixed hyperlipidemia: Secondary | ICD-10-CM | POA: Diagnosis not present

## 2020-12-02 DIAGNOSIS — I10 Essential (primary) hypertension: Secondary | ICD-10-CM | POA: Diagnosis not present

## 2020-12-08 DIAGNOSIS — E782 Mixed hyperlipidemia: Secondary | ICD-10-CM | POA: Diagnosis not present

## 2020-12-08 DIAGNOSIS — I700 Atherosclerosis of aorta: Secondary | ICD-10-CM | POA: Diagnosis not present

## 2020-12-08 DIAGNOSIS — K5900 Constipation, unspecified: Secondary | ICD-10-CM | POA: Diagnosis not present

## 2020-12-08 DIAGNOSIS — R1310 Dysphagia, unspecified: Secondary | ICD-10-CM | POA: Diagnosis not present

## 2020-12-08 DIAGNOSIS — Z87898 Personal history of other specified conditions: Secondary | ICD-10-CM | POA: Diagnosis not present

## 2020-12-08 DIAGNOSIS — E039 Hypothyroidism, unspecified: Secondary | ICD-10-CM | POA: Diagnosis not present

## 2020-12-08 DIAGNOSIS — E042 Nontoxic multinodular goiter: Secondary | ICD-10-CM | POA: Diagnosis not present

## 2020-12-08 DIAGNOSIS — I10 Essential (primary) hypertension: Secondary | ICD-10-CM | POA: Diagnosis not present

## 2020-12-12 DIAGNOSIS — H524 Presbyopia: Secondary | ICD-10-CM | POA: Diagnosis not present

## 2020-12-22 DIAGNOSIS — H903 Sensorineural hearing loss, bilateral: Secondary | ICD-10-CM | POA: Diagnosis not present

## 2021-02-04 DIAGNOSIS — H5203 Hypermetropia, bilateral: Secondary | ICD-10-CM | POA: Diagnosis not present

## 2021-02-04 DIAGNOSIS — H52209 Unspecified astigmatism, unspecified eye: Secondary | ICD-10-CM | POA: Diagnosis not present

## 2021-02-04 DIAGNOSIS — H524 Presbyopia: Secondary | ICD-10-CM | POA: Diagnosis not present

## 2021-02-10 DIAGNOSIS — M1611 Unilateral primary osteoarthritis, right hip: Secondary | ICD-10-CM | POA: Diagnosis not present

## 2021-02-10 DIAGNOSIS — E782 Mixed hyperlipidemia: Secondary | ICD-10-CM | POA: Diagnosis not present

## 2021-02-10 DIAGNOSIS — I10 Essential (primary) hypertension: Secondary | ICD-10-CM | POA: Diagnosis not present

## 2021-02-10 DIAGNOSIS — E039 Hypothyroidism, unspecified: Secondary | ICD-10-CM | POA: Diagnosis not present

## 2021-02-10 DIAGNOSIS — K219 Gastro-esophageal reflux disease without esophagitis: Secondary | ICD-10-CM | POA: Diagnosis not present

## 2021-02-11 DIAGNOSIS — K5900 Constipation, unspecified: Secondary | ICD-10-CM | POA: Diagnosis not present

## 2021-02-11 DIAGNOSIS — K30 Functional dyspepsia: Secondary | ICD-10-CM | POA: Diagnosis not present

## 2021-02-11 DIAGNOSIS — R1013 Epigastric pain: Secondary | ICD-10-CM | POA: Diagnosis not present

## 2021-02-11 DIAGNOSIS — R110 Nausea: Secondary | ICD-10-CM | POA: Diagnosis not present

## 2021-03-23 DIAGNOSIS — H401131 Primary open-angle glaucoma, bilateral, mild stage: Secondary | ICD-10-CM | POA: Diagnosis not present

## 2021-05-04 DIAGNOSIS — R140 Abdominal distension (gaseous): Secondary | ICD-10-CM | POA: Diagnosis not present

## 2021-05-04 DIAGNOSIS — K219 Gastro-esophageal reflux disease without esophagitis: Secondary | ICD-10-CM | POA: Diagnosis not present

## 2021-05-04 DIAGNOSIS — K5909 Other constipation: Secondary | ICD-10-CM | POA: Diagnosis not present

## 2021-05-07 DIAGNOSIS — M1611 Unilateral primary osteoarthritis, right hip: Secondary | ICD-10-CM | POA: Diagnosis not present

## 2021-05-07 DIAGNOSIS — K219 Gastro-esophageal reflux disease without esophagitis: Secondary | ICD-10-CM | POA: Diagnosis not present

## 2021-05-07 DIAGNOSIS — I10 Essential (primary) hypertension: Secondary | ICD-10-CM | POA: Diagnosis not present

## 2021-05-07 DIAGNOSIS — E039 Hypothyroidism, unspecified: Secondary | ICD-10-CM | POA: Diagnosis not present

## 2021-05-07 DIAGNOSIS — E782 Mixed hyperlipidemia: Secondary | ICD-10-CM | POA: Diagnosis not present

## 2021-05-29 ENCOUNTER — Other Ambulatory Visit: Payer: Self-pay

## 2021-05-29 ENCOUNTER — Emergency Department (HOSPITAL_COMMUNITY)
Admission: EM | Admit: 2021-05-29 | Discharge: 2021-05-29 | Disposition: A | Discharge status: Home / Self Care | Payer: Medicare HMO | Attending: Emergency Medicine | Admitting: Emergency Medicine

## 2021-05-29 ENCOUNTER — Encounter (HOSPITAL_COMMUNITY): Payer: Self-pay | Admitting: Emergency Medicine

## 2021-05-29 DIAGNOSIS — E039 Hypothyroidism, unspecified: Secondary | ICD-10-CM | POA: Diagnosis not present

## 2021-05-29 DIAGNOSIS — K922 Gastrointestinal hemorrhage, unspecified: Secondary | ICD-10-CM | POA: Diagnosis not present

## 2021-05-29 DIAGNOSIS — Z7982 Long term (current) use of aspirin: Secondary | ICD-10-CM | POA: Diagnosis not present

## 2021-05-29 DIAGNOSIS — I10 Essential (primary) hypertension: Secondary | ICD-10-CM | POA: Diagnosis not present

## 2021-05-29 DIAGNOSIS — K921 Melena: Secondary | ICD-10-CM | POA: Diagnosis present

## 2021-05-29 LAB — COMPREHENSIVE METABOLIC PANEL
ALT: 14 U/L (ref 0–44)
AST: 34 U/L (ref 15–41)
Albumin: 3.6 g/dL (ref 3.5–5.0)
Alkaline Phosphatase: 38 U/L (ref 38–126)
Anion gap: 8 (ref 5–15)
BUN: 19 mg/dL (ref 8–23)
CO2: 27 mmol/L (ref 22–32)
Calcium: 9 mg/dL (ref 8.9–10.3)
Chloride: 96 mmol/L — ABNORMAL LOW (ref 98–111)
Creatinine, Ser: 1.11 mg/dL (ref 0.61–1.24)
GFR, Estimated: >60 mL/min (ref >60)
Glucose, Bld: 125 mg/dL — ABNORMAL HIGH (ref 70–99)
Potassium: 4.9 mmol/L (ref 3.5–5.1)
Sodium: 131 mmol/L — ABNORMAL LOW (ref 135–145)
Total Bilirubin: 1.3 mg/dL — ABNORMAL HIGH (ref 0.3–1.2)
Total Protein: 5.7 g/dL — ABNORMAL LOW (ref 6.5–8.1)

## 2021-05-29 LAB — CBC WITH DIFFERENTIAL/PLATELET
Abs Immature Granulocytes: 0.04 10*3/uL (ref 0.00–0.07)
Basophils Absolute: 0 10*3/uL (ref 0.0–0.1)
Basophils Relative: 0 %
Eosinophils Absolute: 0 10*3/uL (ref 0.0–0.5)
Eosinophils Relative: 0 %
HCT: 42.3 % (ref 39.0–52.0)
Hemoglobin: 14.6 g/dL (ref 13.0–17.0)
Immature Granulocytes: 0 %
Lymphocytes Relative: 8 %
Lymphs Abs: 1 10*3/uL (ref 0.7–4.0)
MCH: 32.7 pg (ref 26.0–34.0)
MCHC: 34.5 g/dL (ref 30.0–36.0)
MCV: 94.6 fL (ref 80.0–100.0)
Monocytes Absolute: 1.4 10*3/uL — ABNORMAL HIGH (ref 0.1–1.0)
Monocytes Relative: 12 %
Neutro Abs: 9.4 10*3/uL — ABNORMAL HIGH (ref 1.7–7.7)
Neutrophils Relative %: 80 %
Platelets: 332 10*3/uL (ref 150–400)
RBC: 4.47 MIL/uL (ref 4.22–5.81)
RDW: 12.5 % (ref 11.5–15.5)
WBC: 11.9 10*3/uL — ABNORMAL HIGH (ref 4.0–10.5)
nRBC: 0 % (ref 0.0–0.2)

## 2021-05-29 LAB — LIPASE, BLOOD: Lipase: 22 U/L (ref 11–51)

## 2021-05-29 LAB — HEMOGLOBIN AND HEMATOCRIT, BLOOD
HCT: 39.8 % (ref 39.0–52.0)
HCT: 40.6 % (ref 39.0–52.0)
Hemoglobin: 13.5 g/dL (ref 13.0–17.0)
Hemoglobin: 13.5 g/dL (ref 13.0–17.0)

## 2021-05-29 LAB — URINALYSIS, ROUTINE W REFLEX MICROSCOPIC
Bilirubin Urine: NEGATIVE
Glucose, UA: NEGATIVE mg/dL
Hgb urine dipstick: NEGATIVE
Ketones, ur: 5 mg/dL — AB
Leukocytes,Ua: NEGATIVE
Nitrite: NEGATIVE
Protein, ur: NEGATIVE mg/dL
Specific Gravity, Urine: 1.02 (ref 1.005–1.030)
pH: 5 (ref 5.0–8.0)

## 2021-05-29 LAB — POC OCCULT BLOOD, ED: Fecal Occult Bld: POSITIVE — AB

## 2021-05-29 NOTE — Discharge Instructions (Addendum)
Please call your GI office tomorrow to arrange for a follow up appointment.  I expect your bleeding to gradually improve over the next week.  If you have worsening bleeding, lightheadedness, difficulty breathing, or abnormal increased heart rate (over 110 beats per minute), please return to the ER, this may be signs of worsening bleeding but does need medical attention.

## 2021-05-29 NOTE — ED Provider Notes (Signed)
Emergency Medicine Provider Triage Evaluation Note  Mark Ewing , a 80 y.o. male  was evaluated in triage.  Pt complains of abd pain and bloody stool.  Review of Systems  Positive: Abd pain, bloody stool Negative: [place]  Physical Exam  BP 137/79   Pulse 93   Temp 98.8 F (37.1 C)   Resp 14   SpO2 93%  Gen:   Awake, no distress   Resp:  Normal effort  MSK:   Moves extremities without difficulty  Other:  Epigastric ttp  Medical Decision Making  Medically screening exam initiated at 3:54 PM.  Appropriate orders placed.  TOMMEY BARRET was informed that the remainder of the evaluation will be completed by another provider, this initial triage assessment does not replace that evaluation, and the importance of remaining in the ED until their evaluation is complete.     Bishop Dublin 05/29/21 1555    Pattricia Boss, MD 06/02/21 [phone]

## 2021-05-29 NOTE — ED Provider Notes (Signed)
[facility] EMERGENCY DEPARTMENT Provider Note   CSN: [id number] Arrival date & time: 05/29/21  1544     History CC:  Blood in stool   Mark Ewing is a 80 y.o. male with history of reflux presenting to the emergency dapartment with blood in his stool.  The patient reports that he was constipated the past few days, and took a laxative, and that he had then several bowel movements today, the most recent at 3 PM which had bright red blood in the toilet bowl.  This is never happened to him before.  He denies lightheadedness, shortness of breath.  He reports he has chronic epigastric discomfort and takes famotidine as well as sulcal freight for bad reflux.  He follows with [facility] gastroenterology.  He says he has not had a colonoscopy or endoscopy in many years.  He denies any known history of diverticulosis.  He is not on blood thinners.  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"   Carotid artery stenosis    7/82/95 [place]: 62-13% LICA stenosis by velocities, CTA neck rec that showed no sign LICA stenosis   GERD (gastroesophageal reflux disease)    Hyperlipidemia    Hypertension    Hypothyroidism    Inguinal hernia    Symptomatic cholelithiasis    Thyroid disease    Thyroid nodule     Patient Active Problem List   Diagnosis Date Noted   Symptomatic cholelithiasis 06/03/2019    Past Surgical History:  Procedure Laterality Date   CATARACT EXTRACTION W/ INTRAOCULAR LENS  IMPLANT, BILATERAL     CHOLECYSTECTOMY N/A 06/03/2019   Procedure: LAPAROSCOPIC CHOLECYSTECTOMY WITH INTRAOPERATIVE CHOLANGIOGRAM;  Surgeon: Donnie Mesa, MD;  Location: [facility] OR;  Service: General;  Laterality: N/A;   HERNIA REPAIR     TONSILLECTOMY         Family History  Problem Relation Age of Onset   Hypertension Mother    Ovarian cancer Mother    Hypertension Father    CVA Father    Prostate  cancer Father    Hypertension Brother    Hypertension Paternal Grandfather    CVA Paternal Grandfather     Social History   Tobacco Use   Smoking status: Never   Smokeless tobacco: Never  Vaping Use   Vaping Use: Never used  Substance Use Topics   Alcohol use: No   Drug use: No    Home Medications Prior to Admission medications   Medication Sig Start Date End Date Taking? Authorizing Provider  acetaminophen (TYLENOL) 500 MG tablet Take 500 mg by mouth every 6 (six) hours as needed (pain.).    [provider]  aspirin 81 MG tablet Take 81 mg by mouth daily.    [provider]  co-enzyme Q-10 50 MG capsule Take 100 mg by mouth daily.    [provider]  famotidine (PEPCID) 20 MG tablet Take 20 mg by mouth 2 (two) times daily.     [provider]  ibuprofen (ADVIL) 200 MG tablet Take 400 mg by mouth 2 (two) times daily as needed (for pain).     [provider]  levothyroxine (SYNTHROID, LEVOTHROID) 88 MCG tablet Take 88 mcg by mouth daily before breakfast.     [provider]  losartan (COZAAR) 50 MG tablet Take 50 mg by mouth daily.  12/08/18   [provider]  oxyCODONE (OXY IR/ROXICODONE) 5 MG immediate release tablet Take 1 tablet (5 mg total) by mouth every 6 (six) hours as needed for moderate pain, severe pain or breakthrough pain. 06/03/19   Donnie Mesa, MD  simvastatin (ZOCOR) 10 MG tablet Take 10 mg by mouth daily.    [provider]  Turmeric 500 MG CAPS Take 500 mg by mouth daily.    [provider]  verapamil (CALAN-SR) 180 MG CR tablet Take 180 mg by mouth 2 (two) times daily.     [provider]    Allergies    Patient has no known allergies.  Review of Systems   Review of Systems  Constitutional:  Negative for chills and fever.  HENT:  Negative for ear pain and sore throat.   Eyes:  Negative for pain and visual disturbance.  Respiratory:  Negative for cough and shortness of  breath.   Cardiovascular:  Negative for chest pain and palpitations.  Gastrointestinal:  Positive for blood in stool. Negative for nausea and vomiting.  Genitourinary:  Negative for dysuria and hematuria.  Musculoskeletal:  Negative for arthralgias and back pain.  Skin:  Negative for color change and rash.  Neurological:  Negative for syncope, light-headedness and headaches.  All other systems reviewed and are negative.  Physical Exam Updated Vital Signs BP (!) 142/80 (BP Location: Right Arm)   Pulse 81   Temp 98.8 F (37.1 C)   Resp 16   SpO2 98%   Physical Exam Constitutional:      General: He is not in acute distress. HENT:     Head: Normocephalic and atraumatic.  Eyes:     Conjunctiva/sclera: Conjunctivae normal.     Pupils: Pupils are equal, round, and reactive to light.  Cardiovascular:     Rate and Rhythm: Normal rate and regular rhythm.  Pulmonary:     Effort: Pulmonary effort is normal. No respiratory distress.  Abdominal:     General: There is no distension.     Tenderness: There is no abdominal tenderness.  Genitourinary:    Comments: No melena on rectal exam Skin:    General: Skin is warm and dry.  Neurological:     General: No focal deficit present.     Mental Status: He is alert. Mental status is at baseline.  Psychiatric:        Mood and Affect: Mood normal.        Behavior: Behavior normal.    ED Results / Procedures / Treatments   Labs (all labs ordered are listed, but only abnormal results are displayed) Labs Reviewed  CBC WITH DIFFERENTIAL/PLATELET - Abnormal; Notable for the following components:      Result Value   WBC 11.9 (*)    Neutro Abs 9.4 (*)    Monocytes Absolute 1.4 (*)    All other components within normal limits  COMPREHENSIVE METABOLIC PANEL - Abnormal; Notable for the following components:   Sodium 131 (*)    Chloride 96 (*)    Glucose, Bld 125 (*)    Total Protein 5.7 (*)    Total Bilirubin 1.3 (*)    All other components  within normal limits  URINALYSIS, ROUTINE W REFLEX MICROSCOPIC - Abnormal; Notable for the following components:   APPearance HAZY (*)    Ketones, ur 5 (*)    All other components within normal limits  POC OCCULT BLOOD, ED - Abnormal; Notable for the following components:   Fecal Occult Bld POSITIVE (*)  All other components within normal limits  LIPASE, BLOOD  HEMOGLOBIN AND HEMATOCRIT, BLOOD  HEMOGLOBIN AND HEMATOCRIT, BLOOD    EKG EKG Interpretation  Date/Time:  Friday May 29 2021 15:55:45 EDT Ventricular Rate:  91 PR Interval:  130 QRS Duration: 96 QT Interval:  368 QTC Calculation: 452 R Axis:   62 Text Interpretation: Sinus rhythm with occasional Premature ventricular complexes Otherwise normal ECG Confirmed by Octaviano Glow [id number]) on 05/29/2021 7:33:04 PM  Radiology No results found.  Procedures Procedures   Medications Ordered in ED Medications - No data to display  ED Course  I have reviewed the triage vital signs and the nursing notes.  Pertinent labs & imaging results that were available during my care of the patient were reviewed by me and considered in my medical decision making (see chart for details).  This patient complains of blood in his stool.  This involves an extensive number of treatment options, and is a complaint that carries with it a high risk of complications and morbidity.  The differential diagnosis includes Gi bleed (upper or lower) vs other  I ordered, reviewed, and interpreted labs.  No life-threatening abnormalities were noted on these tests.  BUN and Cr normal.  Hgb 14.6 -> 13.5 -> 13.5, repeatedly normal in ED and near baseline levels.  Vitals and HR normal.  He has no symptoms of acute anemia. Abdominal exam benign - doubt diverticulitis or colitis. He likely has gastritis per his history, cannot exclude possibility of gastric ulcer, but this does not appear to be a significant bleed.  I advised contacting his GI office to arrange  for f/u on this issue. I independently visualized and interpreted imaging which showed no life-threatening abnormalities, and the monitor tracing which showed NSR Additional history was obtained from patient's wife at bedside  The patient was observed in the ED for approxiametly 7 hours and reassessed, during which time he remained asymptomatic with normal VS and normal hgb.  I felt it was reasonably safe to discharge him with return precautions.    Clinical Course as of 05/30/21 0946  Fri May 29, 2021  2148 Hemoglobin: 13.5 [MT]  2220 Repeat hgb stable, patient asymptomatic, okay for discharge, advised GI f/u [MT]    Clinical Course User Index [MT] Claudean Leavelle, Carola Rhine, MD    Final Clinical Impression(s) / ED Diagnoses Final diagnoses:  Gastrointestinal hemorrhage, unspecified gastrointestinal hemorrhage type    Rx / DC Orders ED Discharge Orders     None        Wyvonnia Dusky, MD 05/30/21 [phone]

## 2021-05-29 NOTE — ED Triage Notes (Signed)
Pt here from home with c/o blood in his stool times once today with some upper abd pain, no blood thinners , no n/v

## 2021-05-30 ENCOUNTER — Emergency Department (HOSPITAL_COMMUNITY): Payer: Medicare HMO

## 2021-05-30 ENCOUNTER — Encounter (HOSPITAL_COMMUNITY): Payer: Self-pay | Admitting: Emergency Medicine

## 2021-05-30 ENCOUNTER — Observation Stay (HOSPITAL_COMMUNITY)
Admission: EM | Admit: 2021-05-30 | Discharge: 2021-05-31 | Disposition: A | Discharge status: Home / Self Care | Payer: Medicare HMO | Attending: Internal Medicine | Admitting: Internal Medicine

## 2021-05-30 DIAGNOSIS — Z20822 Contact with and (suspected) exposure to covid-19: Secondary | ICD-10-CM | POA: Insufficient documentation

## 2021-05-30 DIAGNOSIS — E782 Mixed hyperlipidemia: Secondary | ICD-10-CM | POA: Diagnosis present

## 2021-05-30 DIAGNOSIS — Z859 Personal history of malignant neoplasm, unspecified: Secondary | ICD-10-CM | POA: Diagnosis not present

## 2021-05-30 DIAGNOSIS — R197 Diarrhea, unspecified: Secondary | ICD-10-CM | POA: Diagnosis present

## 2021-05-30 DIAGNOSIS — R55 Syncope and collapse: Secondary | ICD-10-CM | POA: Diagnosis not present

## 2021-05-30 DIAGNOSIS — K921 Melena: Secondary | ICD-10-CM | POA: Diagnosis not present

## 2021-05-30 DIAGNOSIS — S0003XA Contusion of scalp, initial encounter: Secondary | ICD-10-CM | POA: Diagnosis not present

## 2021-05-30 DIAGNOSIS — R918 Other nonspecific abnormal finding of lung field: Secondary | ICD-10-CM | POA: Diagnosis present

## 2021-05-30 DIAGNOSIS — Z79899 Other long term (current) drug therapy: Secondary | ICD-10-CM | POA: Insufficient documentation

## 2021-05-30 DIAGNOSIS — I2510 Atherosclerotic heart disease of native coronary artery without angina pectoris: Secondary | ICD-10-CM | POA: Diagnosis not present

## 2021-05-30 DIAGNOSIS — I517 Cardiomegaly: Secondary | ICD-10-CM | POA: Diagnosis not present

## 2021-05-30 DIAGNOSIS — R42 Dizziness and giddiness: Secondary | ICD-10-CM | POA: Diagnosis not present

## 2021-05-30 DIAGNOSIS — K219 Gastro-esophageal reflux disease without esophagitis: Secondary | ICD-10-CM | POA: Diagnosis not present

## 2021-05-30 DIAGNOSIS — R0689 Other abnormalities of breathing: Secondary | ICD-10-CM | POA: Diagnosis not present

## 2021-05-30 DIAGNOSIS — R911 Solitary pulmonary nodule: Secondary | ICD-10-CM | POA: Diagnosis not present

## 2021-05-30 DIAGNOSIS — E871 Hypo-osmolality and hyponatremia: Secondary | ICD-10-CM | POA: Diagnosis present

## 2021-05-30 DIAGNOSIS — I10 Essential (primary) hypertension: Secondary | ICD-10-CM | POA: Diagnosis not present

## 2021-05-30 DIAGNOSIS — N281 Cyst of kidney, acquired: Secondary | ICD-10-CM | POA: Diagnosis not present

## 2021-05-30 DIAGNOSIS — Z7982 Long term (current) use of aspirin: Secondary | ICD-10-CM | POA: Insufficient documentation

## 2021-05-30 DIAGNOSIS — R2689 Other abnormalities of gait and mobility: Secondary | ICD-10-CM | POA: Diagnosis not present

## 2021-05-30 DIAGNOSIS — E039 Hypothyroidism, unspecified: Secondary | ICD-10-CM | POA: Diagnosis present

## 2021-05-30 DIAGNOSIS — J439 Emphysema, unspecified: Secondary | ICD-10-CM | POA: Diagnosis not present

## 2021-05-30 DIAGNOSIS — K6389 Other specified diseases of intestine: Secondary | ICD-10-CM | POA: Diagnosis not present

## 2021-05-30 DIAGNOSIS — J811 Chronic pulmonary edema: Secondary | ICD-10-CM | POA: Diagnosis not present

## 2021-05-30 HISTORY — DX: Mixed hyperlipidemia: E78.2

## 2021-05-30 LAB — CBC WITH DIFFERENTIAL/PLATELET
Abs Immature Granulocytes: 0.02 10*3/uL (ref 0.00–0.07)
Basophils Absolute: 0 10*3/uL (ref 0.0–0.1)
Basophils Relative: 0 %
Eosinophils Absolute: 0 10*3/uL (ref 0.0–0.5)
Eosinophils Relative: 0 %
HCT: 40.2 % (ref 39.0–52.0)
Hemoglobin: 14.1 g/dL (ref 13.0–17.0)
Immature Granulocytes: 0 %
Lymphocytes Relative: 5 %
Lymphs Abs: 0.5 10*3/uL — ABNORMAL LOW (ref 0.7–4.0)
MCH: 32.9 pg (ref 26.0–34.0)
MCHC: 35.1 g/dL (ref 30.0–36.0)
MCV: 93.7 fL (ref 80.0–100.0)
Monocytes Absolute: 1.4 10*3/uL — ABNORMAL HIGH (ref 0.1–1.0)
Monocytes Relative: 14 %
Neutro Abs: 8 10*3/uL — ABNORMAL HIGH (ref 1.7–7.7)
Neutrophils Relative %: 81 %
Platelets: 274 10*3/uL (ref 150–400)
RBC: 4.29 MIL/uL (ref 4.22–5.81)
RDW: 12.7 % (ref 11.5–15.5)
WBC: 9.9 10*3/uL (ref 4.0–10.5)
nRBC: 0 % (ref 0.0–0.2)

## 2021-05-30 LAB — BASIC METABOLIC PANEL
Anion gap: 10 (ref 5–15)
BUN: 18 mg/dL (ref 8–23)
CO2: 24 mmol/L (ref 22–32)
Calcium: 8.7 mg/dL — ABNORMAL LOW (ref 8.9–10.3)
Chloride: 95 mmol/L — ABNORMAL LOW (ref 98–111)
Creatinine, Ser: 0.99 mg/dL (ref 0.61–1.24)
GFR, Estimated: >60 mL/min (ref >60)
Glucose, Bld: 136 mg/dL — ABNORMAL HIGH (ref 70–99)
Potassium: 3.9 mmol/L (ref 3.5–5.1)
Sodium: 129 mmol/L — ABNORMAL LOW (ref 135–145)

## 2021-05-30 LAB — URINALYSIS, ROUTINE W REFLEX MICROSCOPIC
Bilirubin Urine: NEGATIVE
Glucose, UA: NEGATIVE mg/dL
Hgb urine dipstick: NEGATIVE
Ketones, ur: NEGATIVE mg/dL
Leukocytes,Ua: NEGATIVE
Nitrite: NEGATIVE
Protein, ur: NEGATIVE mg/dL
Specific Gravity, Urine: 1.018 (ref 1.005–1.030)
pH: 7 (ref 5.0–8.0)

## 2021-05-30 LAB — HEPATIC FUNCTION PANEL
ALT: 13 U/L (ref 0–44)
AST: 23 U/L (ref 15–41)
Albumin: 3.1 g/dL — ABNORMAL LOW (ref 3.5–5.0)
Alkaline Phosphatase: 34 U/L — ABNORMAL LOW (ref 38–126)
Bilirubin, Direct: 0.2 mg/dL (ref 0.0–0.2)
Indirect Bilirubin: 0.8 mg/dL (ref 0.3–0.9)
Total Bilirubin: 1 mg/dL (ref 0.3–1.2)
Total Protein: 5.5 g/dL — ABNORMAL LOW (ref 6.5–8.1)

## 2021-05-30 LAB — RESP PANEL BY RT-PCR (FLU A&B, COVID) ARPGX2
Influenza A by PCR: NEGATIVE
Influenza B by PCR: NEGATIVE
SARS Coronavirus 2 by RT PCR: NEGATIVE

## 2021-05-30 LAB — CBG MONITORING, ED: Glucose-Capillary: 144 mg/dL — ABNORMAL HIGH (ref 70–99)

## 2021-05-30 LAB — TROPONIN I (HIGH SENSITIVITY): Troponin I (High Sensitivity): 5 ng/L (ref <18)

## 2021-05-30 MED ORDER — FAMOTIDINE 20 MG PO TABS: 20.0000 mg | ORAL_TABLET | Freq: Two times a day (BID) | ORAL | Status: DC

## 2021-05-30 MED ORDER — ACETAMINOPHEN 650 MG RE SUPP: 650.0000 mg | Freq: Four times a day (QID) | RECTAL | Status: DC | PRN

## 2021-05-30 MED ORDER — ONDANSETRON HCL 4 MG/2ML IJ SOLN: 4.0000 mg | Freq: Four times a day (QID) | INTRAMUSCULAR | Status: DC | PRN

## 2021-05-30 MED ORDER — VERAPAMIL HCL ER 180 MG PO TBCR
180.0000 mg | EXTENDED_RELEASE_TABLET | Freq: Two times a day (BID) | ORAL | Status: DC
  Administered 2021-05-31: 180 mg via ORAL
  Filled 2021-05-30 (×3): qty 1

## 2021-05-30 MED ORDER — SODIUM CHLORIDE 0.9% FLUSH
3.0000 mL | Freq: Two times a day (BID) | INTRAVENOUS | Status: DC
  Administered 2021-05-31: 3 mL via INTRAVENOUS

## 2021-05-30 MED ORDER — IOHEXOL 350 MG/ML SOLN
100.0000 mL | Freq: Once | INTRAVENOUS | Status: AC | PRN
  Administered 2021-05-30: 100 mL via INTRAVENOUS

## 2021-05-30 MED ORDER — PANTOPRAZOLE SODIUM 40 MG IV SOLR
40.0000 mg | Freq: Once | INTRAVENOUS | Status: AC
  Administered 2021-05-31: 40 mg via INTRAVENOUS
  Filled 2021-05-30: qty 40

## 2021-05-30 MED ORDER — SODIUM CHLORIDE 0.9 % IV SOLN: INTRAVENOUS | Status: AC

## 2021-05-30 MED ORDER — LATANOPROST 0.005 % OP SOLN
1.0000 [drp] | Freq: Every day | OPHTHALMIC | Status: DC
  Administered 2021-05-31: 1 [drp] via OPHTHALMIC
  Filled 2021-05-30: qty 2.5

## 2021-05-30 MED ORDER — PANTOPRAZOLE SODIUM 40 MG PO TBEC
40.0000 mg | DELAYED_RELEASE_TABLET | Freq: Every day | ORAL | Status: DC
  Administered 2021-05-31: 40 mg via ORAL
  Filled 2021-05-30: qty 1

## 2021-05-30 MED ORDER — LEVOTHYROXINE SODIUM 100 MCG PO TABS
100.0000 ug | ORAL_TABLET | Freq: Every day | ORAL | Status: DC
  Administered 2021-05-31: 100 ug via ORAL
  Filled 2021-05-30: qty 1

## 2021-05-30 MED ORDER — SODIUM CHLORIDE 0.9 % IV BOLUS
1000.0000 mL | Freq: Once | INTRAVENOUS | Status: AC
  Administered 2021-05-30: 1000 mL via INTRAVENOUS

## 2021-05-30 MED ORDER — LACTATED RINGERS IV BOLUS
1000.0000 mL | Freq: Once | INTRAVENOUS | Status: AC
  Administered 2021-05-30: 1000 mL via INTRAVENOUS

## 2021-05-30 MED ORDER — ACETAMINOPHEN 325 MG PO TABS
650.0000 mg | ORAL_TABLET | Freq: Four times a day (QID) | ORAL | Status: DC | PRN
  Administered 2021-05-31: 650 mg via ORAL
  Filled 2021-05-30 (×2): qty 2

## 2021-05-30 MED ORDER — SIMVASTATIN 20 MG PO TABS
10.0000 mg | ORAL_TABLET | Freq: Every day | ORAL | Status: DC
  Administered 2021-05-31: 10 mg via ORAL
  Filled 2021-05-30: qty 1

## 2021-05-30 MED ORDER — LOSARTAN POTASSIUM 50 MG PO TABS
50.0000 mg | ORAL_TABLET | Freq: Every day | ORAL | Status: DC
  Administered 2021-05-31: 50 mg via ORAL
  Filled 2021-05-30: qty 1

## 2021-05-30 MED ORDER — ONDANSETRON HCL 4 MG PO TABS: 4.0000 mg | ORAL_TABLET | Freq: Four times a day (QID) | ORAL | Status: DC | PRN

## 2021-05-30 NOTE — ED Provider Notes (Signed)
Emergency Medicine Provider Triage Evaluation Note  Mark Ewing , a 80 y.o. male  was evaluated in triage.  Pt complains of syncopal episode. Patient was seen in the ER yesterday for upper abdominal pain with bright red blood in stool. Woke up around 3AM and was in the kitchen when he developed a "cold sweat and felt fuzzy." Passed out and hit the left parietal scalp on the counter resulting in an abrasion of the scalp. Unsure how long he laid on the floor before waking up, was able to stand on his own at that time. Continues to feel "fuzzy." Not on thinners.   Review of Systems  Positive: syncope Negative: CP  Physical Exam  BP 130/64 (BP Location: Right Arm)   Pulse 89   Temp 98.1 F (36.7 C)   Resp 19   SpO2 96%  Gen:   Awake, no distress   Resp:  Normal effort  MSK:   Moves extremities without difficulty  Other:  No midline neck or back tenderness, abrasion to scalp  Medical Decision Making  Medically screening exam initiated at 1:44 PM.  Appropriate orders placed.  MEYER ARORA was informed that the remainder of the evaluation will be completed by another provider, this initial triage assessment does not replace that evaluation, and the importance of remaining in the ED until their evaluation is complete.     Tacy Learn, PA-C 05/30/21 1346    Jeanell Sparrow, DO 05/31/21 1114

## 2021-05-30 NOTE — ED Provider Notes (Signed)
[facility] EMERGENCY DEPARTMENT Provider Note   CSN: [id number] Arrival date & time: 05/30/21  1318     History Chief Complaint  Patient presents with   Loss of Consciousness    Mark Ewing is a 80 y.o. male.   Loss of Consciousness Associated symptoms: no chest pain, no fever, no palpitations, no seizures, no shortness of breath and no vomiting    80 year old male with a past medical history of carotid stenosis, hypertension, hyperlipidemia presenting to the emergency department following an episode of syncope.  Patient was seen in the emergency department yesterday for bright red blood per rectum.  He had a positive Hemoccult, but the patient had normal hemoglobin trend and was hemodynamically stable and after shared decision making the patient was discharged home.  He states that this evening, the patient was experiencing some cramping periumbilical abdominal pain.  He got up out of bed to go take care of it in the kitchen when he became lightheaded, flushed, and felt cold chills.  He then woke up on the floor.  He denies any chest pain or shortness of breath.  He states that he did hit his head when he syncopized.  He states that he tried to rest in his arm chair for the rest the day, but he still feels fatigued, malaise.  Additionally, the patient still reports persistent mid abdominal cramping.  Constant, radiates to his back.  He did have multiple bowel movements today that were brown in character, no melena or hematochezia reported today.  He denies any current chest pain or shortness of breath.  He denies any dysuria or hematuria.  He denies any history of similar symptoms.  He denies any fever, cough, but states that he has had a decreased energy level and decreased appetite all day today.  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"   Carotid artery stenosis    0/27/25 [place]: 36-64%  LICA stenosis by velocities, CTA neck rec that showed no sign LICA stenosis   GERD (gastroesophageal reflux disease)    Hyperlipidemia    Hypertension    Hypothyroidism    Inguinal hernia    Mixed hyperlipidemia 05/30/2021   Symptomatic cholelithiasis    Thyroid disease    Thyroid nodule     Patient Active Problem List   Diagnosis Date Noted   Syncope 05/30/2021   GERD (gastroesophageal reflux disease) 05/30/2021   Mixed hyperlipidemia 05/30/2021   Hypothyroidism 05/30/2021   Acute diarrhea 05/30/2021   Hematochezia 05/30/2021   Nodule of upper lobe of right lung 05/30/2021   Hyponatremia 05/30/2021   Symptomatic cholelithiasis 06/03/2019    Past Surgical History:  Procedure Laterality Date   CATARACT EXTRACTION W/ INTRAOCULAR LENS  IMPLANT, BILATERAL     CHOLECYSTECTOMY N/A 06/03/2019   Procedure: LAPAROSCOPIC CHOLECYSTECTOMY WITH INTRAOPERATIVE CHOLANGIOGRAM;  Surgeon: Donnie Mesa, MD;  Location: [facility] OR;  Service: General;  Laterality: N/A;   HERNIA REPAIR     TONSILLECTOMY         Family History  Problem Relation Age of Onset   Hypertension Mother    Ovarian cancer Mother    Hypertension Father    CVA Father    Prostate cancer Father    Hypertension Brother    Hypertension Paternal Grandfather    CVA Paternal Grandfather     Social History   Tobacco Use   Smoking status: Never  Smokeless tobacco: Never  Vaping Use   Vaping Use: Never used  Substance Use Topics   Alcohol use: No   Drug use: No    Home Medications Prior to Admission medications   Medication Sig Start Date End Date Taking? Authorizing Provider  acetaminophen (TYLENOL) 500 MG tablet Take 1,000 mg by mouth every 6 (six) hours as needed for moderate pain.   Yes [provider]  aspirin 81 MG tablet Take 81 mg by mouth daily.   Yes [provider]  co-enzyme Q-10 50 MG capsule Take 100 mg by mouth daily.   Yes [provider]  famotidine (PEPCID) 20 MG tablet  Take 20 mg by mouth 2 (two) times daily.   Yes [provider]  latanoprost (XALATAN) 0.005 % ophthalmic solution Place 1 drop into both eyes at bedtime. 03/23/21  Yes [provider]  levothyroxine (SYNTHROID) 100 MCG tablet Take 100 mcg by mouth daily before breakfast.   Yes [provider]  losartan (COZAAR) 50 MG tablet Take 50 mg by mouth daily.  12/08/18  Yes [provider]  simvastatin (ZOCOR) 10 MG tablet Take 10 mg by mouth daily.   Yes [provider]  sucralfate (CARAFATE) 1 g tablet Take 1 g by mouth 2 (two) times daily. 05/28/21  Yes [provider]  Turmeric 500 MG CAPS Take 500 mg by mouth daily.   Yes [provider]  verapamil (CALAN-SR) 180 MG CR tablet Take 180 mg by mouth 2 (two) times daily.    Yes [provider]  oxyCODONE (OXY IR/ROXICODONE) 5 MG immediate release tablet Take 1 tablet (5 mg total) by mouth every 6 (six) hours as needed for moderate pain, severe pain or breakthrough pain. Patient not taking: No sig reported 06/03/19   Donnie Mesa, MD    Allergies    Patient has no known allergies.  Review of Systems   Review of Systems  Constitutional:  Negative for chills and fever.  HENT:  Negative for ear pain and sore throat.   Eyes:  Negative for pain and visual disturbance.  Respiratory:  Negative for cough and shortness of breath.   Cardiovascular:  Positive for syncope. Negative for chest pain and palpitations.  Gastrointestinal:  Negative for abdominal pain and vomiting.  Genitourinary:  Negative for dysuria and hematuria.  Musculoskeletal:  Negative for arthralgias and back pain.  Skin:  Negative for color change and rash.  Neurological:  Negative for seizures and syncope.  All other systems reviewed and are negative.  Physical Exam Updated Vital Signs BP (!) 150/84   Pulse 81   Temp 98.1 F (36.7 C)   Resp 18   SpO2 95%   Physical Exam Vitals and nursing note reviewed.   Constitutional:      General: He is not in acute distress.    Appearance: Normal appearance. He is well-developed and normal weight. He is not ill-appearing or toxic-appearing.  HENT:     Head: Normocephalic.     Comments: Abrasion to the top of his scalp Eyes:     Conjunctiva/sclera: Conjunctivae normal.  Cardiovascular:     Rate and Rhythm: Normal rate and regular rhythm.     Heart sounds: No murmur heard. Pulmonary:     Effort: Pulmonary effort is normal. No respiratory distress.     Breath sounds: Normal breath sounds. No stridor. No wheezing, rhonchi or rales.  Abdominal:     Palpations: Abdomen is soft.     Tenderness: There  is abdominal tenderness (generalized). There is no guarding or rebound.  Musculoskeletal:     Cervical back: Neck supple.  Skin:    General: Skin is warm and dry.     Capillary Refill: Capillary refill takes less than 2 seconds.  Neurological:     Mental Status: He is alert and oriented to person, place, and time.    ED Results / Procedures / Treatments   Labs (all labs ordered are listed, but only abnormal results are displayed) Labs Reviewed  BASIC METABOLIC PANEL - Abnormal; Notable for the following components:      Result Value   Sodium 129 (*)    Chloride 95 (*)    Glucose, Bld 136 (*)    Calcium 8.7 (*)    All other components within normal limits  CBC WITH DIFFERENTIAL/PLATELET - Abnormal; Notable for the following components:   Neutro Abs 8.0 (*)    Lymphs Abs 0.5 (*)    Monocytes Absolute 1.4 (*)    All other components within normal limits  HEPATIC FUNCTION PANEL - Abnormal; Notable for the following components:   Total Protein 5.5 (*)    Albumin 3.1 (*)    Alkaline Phosphatase 34 (*)    All other components within normal limits  CBG MONITORING, ED - Abnormal; Notable for the following components:   Glucose-Capillary 144 (*)    All other components within normal limits  RESP PANEL BY RT-PCR (FLU A&B, COVID) ARPGX2  URINALYSIS,  ROUTINE W REFLEX MICROSCOPIC  HEPATIC FUNCTION PANEL  COMPREHENSIVE METABOLIC PANEL  MAGNESIUM  CBC WITH DIFFERENTIAL/PLATELET  SODIUM, URINE, RANDOM  OSMOLALITY, URINE  OSMOLALITY  TROPONIN I (HIGH SENSITIVITY)    EKG EKG Interpretation  Date/Time:  Saturday May 30 2021 13:51:38 EDT Ventricular Rate:  87 PR Interval:  138 QRS Duration: 94 QT Interval:  376 QTC Calculation: 452 R Axis:   52 Text Interpretation: Sinus rhythm with occasional Premature ventricular complexes Otherwise normal ECG Confirmed by Octaviano Glow [phone]) on 05/30/2021 5:54:52 PM  Radiology CT HEAD WO CONTRAST  Result Date: 05/30/2021 CLINICAL DATA:  Head trauma due to fall EXAM: CT HEAD WITHOUT CONTRAST TECHNIQUE: Contiguous axial images were obtained from the base of the skull through the vertex without intravenous contrast. COMPARISON:  None. FINDINGS: Brain: No evidence of acute infarction, hemorrhage, hydrocephalus, extra-axial collection or mass lesion/mass effect. Mild low-density changes within the periventricular and subcortical white matter compatible with chronic microvascular ischemic change. Mild diffuse cerebral volume loss. Vascular: Atherosclerotic calcifications involving the large vessels of the skull base. No unexpected hyperdense vessel. Skull: Normal. Negative for fracture or focal lesion. Sinuses/Orbits: There is opacification within a posterior right ethmoid air cell. Otherwise negative. Other: Negative for scalp hematoma. IMPRESSION: 1. No acute intracranial findings. 2. Mild chronic microvascular ischemic change and cerebral volume loss. Electronically Signed   By: Davina Poke D.O.   On: 05/30/2021 18:36   DG Chest Portable 1 View  Result Date: 05/30/2021 CLINICAL DATA:  Concern for cardiomegaly and pulmonary edema. EXAM: PORTABLE CHEST 1 VIEW COMPARISON:  Chest radiograph dated 02/15/2017. FINDINGS: The heart size and mediastinal contours are within normal limits. Both lungs are  clear. Degenerative changes are seen in the spine. IMPRESSION: No active disease. Electronically Signed   By: Zerita Boers M.D.   On: 05/30/2021 16:08   CT Angio Chest/Abd/Pel for Dissection W and/or Wo Contrast  Result Date: 05/30/2021 CLINICAL DATA:  Abdominal pain, aortic dissection suspected Upper abdominal pain and blood in stool. Dizziness and  syncopal episode. EXAM: CT ANGIOGRAPHY CHEST, ABDOMEN AND PELVIS TECHNIQUE: Non-contrast CT of the chest was initially obtained. Multidetector CT imaging through the chest, abdomen and pelvis was performed using the standard protocol during bolus administration of intravenous contrast. Multiplanar reconstructed images and MIPs were obtained and reviewed to evaluate the vascular anatomy. CONTRAST:  137mL OMNIPAQUE IOHEXOL 350 MG/ML SOLN COMPARISON:  Chest radiograph earlier today. Abdominal ultrasound 04/17/2020 abdominal CT a 12/13/2018 FINDINGS: CTA CHEST FINDINGS Cardiovascular: No aortic aneurysm. Moderate atherosclerotic calcifications throughout the aorta without dissection or acute aortic findings. No aortic hematoma noncontrast exam. There is no central pulmonary embolus to the segmental level. Heart is normal in size. Coronary artery calcifications. No pericardial effusion. Mediastinum/Nodes: 8 mm right hilar lymph node. No enlarged mediastinal or left hilar lymph nodes. Decompressed esophagus. No thyroid nodule. No supraclavicular or axillary adenopathy. Lungs/Pleura: Lobulated peripheral right upper lobe pulmonary nodule measures 13 x 11 mm, series 8, image 21. Spiculated anterior right upper lobe nodule measures 2.0 x 2.3 cm, series 8, image 43. This nodule abuts the minor fissure and lateral aspect of the mediastinum. Minimal apical emphysema. No pneumonia or pulmonary edema. No pleural fluid. Musculoskeletal: Remote healed sternal fracture. Thoracic spondylosis with anterior spurring. No evidence of focal bone lesion or acute osseous abnormality. Review  of the MIP images confirms the above findings. CTA ABDOMEN AND PELVIS FINDINGS VASCULAR Aorta: Moderate atherosclerosis. No aneurysm or dissection. No significant stenosis. No vasculitis. Celiac: Patent without evidence of aneurysm, dissection, vasculitis or significant stenosis. Mild plaque at the origin. Distal branch vessels are patent. SMA: Patent without evidence of aneurysm, dissection, vasculitis or significant stenosis. Distal branches are patent. Renals: 2 right and 2 left renal arteries. All renal arteries are patent without stenosis or acute findings. IMA: Patent. Inflow: Patent without evidence of aneurysm, dissection, vasculitis or significant stenosis. Veins: No portal venous or mesenteric gas. No obvious venous abnormality on this arterial phase exam. Review of the MIP images confirms the above findings. NON-VASCULAR Hepatobiliary: Cholecystectomy. No focal hepatic abnormality on arterial phase imaging. Pancreas: No ductal dilatation or inflammation. Spleen: Normal in size and arterial enhancement. Adrenals/Urinary Tract: 15 mm left adrenal nodule is stable from prior exam. Normal right adrenal gland. There are bilateral peripelvic cysts. No hydronephrosis or perinephric edema. Homogeneous renal enhancement. Unremarkable urinary bladder. Stomach/Bowel: Moderate length segment of fluid-filled small bowel with wall thickening and enhancement involving the mid left abdominal bowel loops, for example series 6, image 139. There is minimal adjacent mesenteric edema. No pneumatosis. No terminal ileal involvement. Ingested material including likely pills in the stomach. Moderate volume of stool throughout the colon without colonic wall thickening or hyperemia. Sigmoid colon is redundant. No pericolonic inflammation. Lymphatic: Small central mesenteric lymph nodes not enlarged by size criteria. No retroperitoneal adenopathy. No pelvic adenopathy. Reproductive: Prostate is unremarkable. Other: There is trace  free fluid in the dependent pelvis, series 6, image 180. No free air or focal fluid collection. Small fat containing umbilical hernia. Musculoskeletal: There are no acute or suspicious osseous abnormalities. Multilevel degenerative change in the lumbar spine. Scattered degenerative subchondral cysts and bone islands in the pelvis. Review of the MIP images confirms the above findings. IMPRESSION: Vascular: 1. Thoracoabdominal aortic atherosclerosis without dissection, aneurysm, or acute vascular findings. 2. Patent mesenteric arteries and aortic branch vessels. Nonvascular: 1. Two separate nodules in the right upper lobe, both of which are suspicious for malignancy, either primary or metastatic. Larger nodule is spiculated measuring 2.0 x 2.3 cm, smaller nodule measures 1.3  x 1.1 cm. Recommend oncologic workup. 2. Moderate length segment of fluid-filled small bowel with wall thickening and enhancement involving the mid left abdominal bowel loops with minimal adjacent mesenteric edema, suspicious for enteritis. Similar findings are seen on 2020 CT. No evidence of obstruction, perforation, or abscess. 3. Stable left adrenal nodule, likely adenoma. 4. Bilateral peripelvic cysts. Aortic Atherosclerosis (ICD10-I70.0) and Emphysema (ICD10-J43.9). Electronically Signed   By: Keith Rake M.D.   On: 05/30/2021 18:48    Procedures Procedures   Medications Ordered in ED Medications  simvastatin (ZOCOR) tablet 10 mg (has no administration in time range)  levothyroxine (SYNTHROID) tablet 100 mcg (has no administration in time range)  losartan (COZAAR) tablet 50 mg (has no administration in time range)  verapamil (CALAN-SR) CR tablet 180 mg (has no administration in time range)  latanoprost (XALATAN) 0.005 % ophthalmic solution 1 drop (has no administration in time range)  sodium chloride flush (NS) 0.9 % injection 3 mL (has no administration in time range)  acetaminophen (TYLENOL) tablet 650 mg (has no  administration in time range)    Or  acetaminophen (TYLENOL) suppository 650 mg (has no administration in time range)  ondansetron (ZOFRAN) tablet 4 mg (has no administration in time range)    Or  ondansetron (ZOFRAN) injection 4 mg (has no administration in time range)  pantoprazole (PROTONIX) injection 40 mg (has no administration in time range)    Followed by  pantoprazole (PROTONIX) EC tablet 40 mg (has no administration in time range)  0.9 %  sodium chloride infusion (has no administration in time range)  lactated ringers bolus 1,000 mL (0 mLs Intravenous Stopped 05/30/21 1950)  sodium chloride 0.9 % bolus 1,000 mL (1,000 mLs Intravenous New Bag/Given 05/30/21 1954)  iohexol (OMNIPAQUE) 350 MG/ML injection 100 mL (100 mLs Intravenous Contrast Given 05/30/21 1829)    ED Course  I have reviewed the triage vital signs and the nursing notes.  Pertinent labs & imaging results that were available during my care of the patient were reviewed by me and considered in my medical decision making (see chart for details).  MDM Rules/Calculators/A&P                           80 year old male with above past medical history presenting to the emergency department following episode of syncope.  Patient was seen yesterday in the emergency department for viral blood per rectum, which has resolved per patient.  He also describes some significant fatigue in the past several days.  Vital signs reviewed on arrival, within acceptable limits.  Physical exam is notable for overall well-appearing male in no acute distress.  He has an abrasion to the top of his scalp but has a normal neurologic exam.  Will obtain EKG, CBC, BMP.  Given his abdominal pain and syncope, will obtain a CT dissection study to evaluate for an abdominal aneurysm or other intra-abdominal pathology.  Will administer fluids.  No significant anemia.  Mild hyponatremia of 129, down from 131 yesterday.  Potassium within acceptable limits.   Creatinine normal.  LFTs are within acceptable limits.  Negative troponin initially.  EKG demonstrates To PVC but otherwise sinus rhythm without any anatomic ST elevation or ST depression to be concerning for acute ischemia.  No Brugada pattern or other evidence of an accessory pathway.  No high-grade AV block.  CT of the patient's chest abdomen pelvis demonstrated no acute aortic pathology, but did demonstrate several spiculated nodules within the  lungs that are concerning for malignancy.  Patient reports that his colonoscopy is most recently done 5 years ago and was normal per him.  He denies any tobacco use.  I informed him of the concerns for malignant nodules.  Overall, the patient's episode of syncope seems unrelated to his bright red blood per rectum.  The CT scan shows an enteritis and he may be volume down leading to his syncope, but the patient has never had an echocardiogram and I believe warrants observation overnight to have an evaluation for cardiac syncope.  Discussed the case with the hospitalist, patient will be admitted.  Final Clinical Impression(s) / ED Diagnoses Final diagnoses:  Syncope and collapse    Rx / DC Orders ED Discharge Orders     None        Claud Kelp, MD 05/30/21 2159    Wyvonnia Dusky, MD 05/31/21 [phone]

## 2021-05-30 NOTE — ED Triage Notes (Signed)
Pt states he was seen in ED yesterday for upper abd pain and blood in stool.  Reports dizziness and syncopal episode around 3:45am.  Fell and hit head with + LOC.  States he is still having upper abd pain.  No blood thinners.

## 2021-05-30 NOTE — ED Notes (Signed)
This RN attempted IV placement twice with no success, consulting second RN at this time.

## 2021-05-30 NOTE — H&P (Addendum)
History and Physical    Mark Ewing:[id number] DOB: December 11, 1940 DOA: 05/30/2021  PCP: Antony Contras, MD  Patient coming from: Home  Chief Complaint:  Chief Complaint  Patient presents with   Loss of Consciousness     HPI:    80 year old male with past medical history of gastroesophageal reflux disease, hypothyroidism, hypertension, hyperlipidemia presenting to [facility] emergency department after experiencing an episode of syncope.  Patient explains that yesterday morning he began to experience epigastric pain.  This pain was mild in intensity, crampy in quality.  After several hours this discomfort became associated with several bouts of loose and watery stools.  After experiencing 4-5 episodes patient experienced 1 episode that appeared to mostly be bright red blood mixed with stool.  This prompted the patient to present to [facility] emergency department 8/12 for evaluation.  After several hours of observation and serial CBCs patient was found to have a stable hemoglobin and no further episodes of bleeding and was discharged home at approximately 11 PM.  Upon arriving home patient still continued to experience abdominal cramping and pain as well as generalized malaise and weakness.  Patient went to bed but awoke at approximately 3 PM and was walking to the kitchen to get a glass of water when he suddenly felt intense lightheadedness and lost consciousness.  Patient denies any associated tongue biting self urination or self defecation.  Patient believes he may have hit his head while falling.  Patient proceeded to go sleep in his recliner after this episode.  The following morning and early afternoon the patient denied experiencing any further episodes of loss of consciousness.  Patient continued to experience abdominal pain throughout the day and further loose stools but denies any further episodes of hematochezia.  Patient eventually presented to [facility] emergency department for further evaluation.  Upon evaluation in the emergency department, patient was felt to be clinically volume depleted and was initiated on intravenous fluids.  CT angiogram of the chest abdomen and pelvis was performed which revealed no evidence of dissection but did reveal the incidental finding of 2 nodules in the right upper lung with spiculated features concerning for possible malignancy.  Patient additionally found to have moderate fluid-filled small bowel loops concerning for enteritis.  Patient was additionally found to have mild hyponatremia.  Hospitalist group was then called to assess the patient for admission to the hospital.  Review of Systems:   Review of Systems  Constitutional:  Positive for malaise/fatigue.  Gastrointestinal:  Positive for abdominal pain, blood in stool and diarrhea.  Neurological:  Positive for weakness.  All other systems reviewed and are negative.  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"   Carotid artery stenosis    6/50/35 [place]: 46-56% LICA stenosis by velocities, CTA neck rec that showed no sign LICA stenosis   GERD (gastroesophageal reflux disease)    Hyperlipidemia    Hypertension    Hypothyroidism    Inguinal hernia    Mixed hyperlipidemia 05/30/2021   Symptomatic cholelithiasis    Thyroid disease    Thyroid nodule     Past Surgical History:  Procedure Laterality Date   CATARACT EXTRACTION W/ INTRAOCULAR LENS  IMPLANT, BILATERAL     CHOLECYSTECTOMY N/A 06/03/2019   Procedure: LAPAROSCOPIC CHOLECYSTECTOMY WITH INTRAOPERATIVE CHOLANGIOGRAM;  Surgeon: Donnie Mesa, MD;  Location: [place];  Service: General;  Laterality: N/A;  HERNIA REPAIR     TONSILLECTOMY       reports that he has never smoked. He has never used smokeless tobacco. He reports that he does not drink alcohol and does not use drugs.  No Known Allergies  Family History   Problem Relation Age of Onset   Hypertension Mother    Ovarian cancer Mother    Hypertension Father    CVA Father    Prostate cancer Father    Hypertension Brother    Hypertension Paternal Grandfather    CVA Paternal Grandfather      Prior to Admission medications   Medication Sig Start Date End Date Taking? Authorizing Provider  acetaminophen (TYLENOL) 500 MG tablet Take 1,000 mg by mouth every 6 (six) hours as needed for moderate pain.   Yes [provider]  aspirin 81 MG tablet Take 81 mg by mouth daily.   Yes [provider]  co-enzyme Q-10 50 MG capsule Take 100 mg by mouth daily.   Yes [provider]  famotidine (PEPCID) 20 MG tablet Take 20 mg by mouth 2 (two) times daily.   Yes [provider]  latanoprost (XALATAN) 0.005 % ophthalmic solution Place 1 drop into both eyes at bedtime. 03/23/21  Yes [provider]  levothyroxine (SYNTHROID) 100 MCG tablet Take 100 mcg by mouth daily before breakfast.   Yes [provider]  losartan (COZAAR) 50 MG tablet Take 50 mg by mouth daily.  12/08/18  Yes [provider]  simvastatin (ZOCOR) 10 MG tablet Take 10 mg by mouth daily.   Yes [provider]  sucralfate (CARAFATE) 1 g tablet Take 1 g by mouth 2 (two) times daily. 05/28/21  Yes [provider]  Turmeric 500 MG CAPS Take 500 mg by mouth daily.   Yes [provider]  verapamil (CALAN-SR) 180 MG CR tablet Take 180 mg by mouth 2 (two) times daily.    Yes [provider]  oxyCODONE (OXY IR/ROXICODONE) 5 MG immediate release tablet Take 1 tablet (5 mg total) by mouth every 6 (six) hours as needed for moderate pain, severe pain or breakthrough pain. Patient not taking: No sig reported 06/03/19   Donnie Mesa, MD    Physical Exam: Vitals:   05/30/21 1338 05/30/21 1545 05/30/21 1645 05/30/21 1745  BP: 130/64 (!) 174/161 (!) 162/89 (!) 150/84  Pulse: 89 85 80 81  Resp: 19 17 19 18   Temp:  98.1 F (36.7 C)     SpO2: 96% 98% 97% 95%    Constitutional: Awake alert and oriented x3, no associated distress.   Skin: no rashes, no lesions, notable poor skin turgor. Eyes: Pupils are equally reactive to light.  No evidence of scleral icterus or conjunctival pallor.  ENMT: Dry mucous membranes noted.  Posterior pharynx clear of any exudate or lesions.   Neck: normal, supple, no masses, no thyromegaly.  No evidence of jugular venous distension.   Respiratory: clear to auscultation bilaterally, no wheezing, no crackles. Normal respiratory effort. No accessory muscle use.  Cardiovascular: Regular rate and rhythm, no murmurs / rubs / gallops. No extremity edema. 2+ pedal pulses. No carotid bruits.  Chest:   Nontender without crepitus or deformity.   Back:   Nontender without crepitus or deformity. Abdomen: Notable generalized abdominal tenderness, worst in the epigastric region.  No evidence of intra-abdominal masses.  Positive bowel sounds noted in all quadrants.   Musculoskeletal: No joint deformity upper and lower extremities. Good ROM, no contractures. Normal muscle  tone.  Neurologic: CN 2-12 grossly intact. Sensation intact.  Patient moving all 4 extremities spontaneously.  Patient is following all commands.  Patient is responsive to verbal stimuli.   Psychiatric: Patient exhibits normal mood with appropriate affect.  Patient seems to possess insight as to their current situation.     Labs on Admission: I have personally reviewed following labs and imaging studies -   CBC: Recent Labs  Lab 05/29/21 1556 05/29/21 1959 05/29/21 2130 05/30/21 1344  WBC 11.9*  --   --  9.9  NEUTROABS 9.4*  --   --  8.0*  HGB 14.6 13.5 13.5 14.1  HCT 42.3 39.8 40.6 40.2  MCV 94.6  --   --  93.7  PLT 332  --   --  703   Basic Metabolic Panel: Recent Labs  Lab 05/29/21 1556 05/30/21 1344  NA 131* 129*  K 4.9 3.9  CL 96* 95*  CO2 27 24  GLUCOSE 125* 136*  BUN 19 18  CREATININE 1.11 0.99   CALCIUM 9.0 8.7*   GFR: CrCl cannot be calculated (Unknown ideal weight.). Liver Function Tests: Recent Labs  Lab 05/29/21 1556 05/30/21 1344  AST 34 23  ALT 14 13  ALKPHOS 38 34*  BILITOT 1.3* 1.0  PROT 5.7* 5.5*  ALBUMIN 3.6 3.1*   Recent Labs  Lab 05/29/21 1556  LIPASE 22   No results for input(s): AMMONIA in the last 168 hours. Coagulation Profile: No results for input(s): INR, PROTIME in the last 168 hours. Cardiac Enzymes: No results for input(s): CKTOTAL, CKMB, CKMBINDEX, TROPONINI in the last 168 hours. BNP (last 3 results) No results for input(s): PROBNP in the last 8760 hours. HbA1C: No results for input(s): HGBA1C in the last 72 hours. CBG: Recent Labs  Lab 05/30/21 1343  GLUCAP 144*   Lipid Profile: No results for input(s): CHOL, HDL, LDLCALC, TRIG, CHOLHDL, LDLDIRECT in the last 72 hours. Thyroid Function Tests: No results for input(s): TSH, T4TOTAL, FREET4, T3FREE, THYROIDAB in the last 72 hours. Anemia Panel: No results for input(s): VITAMINB12, FOLATE, FERRITIN, TIBC, IRON, RETICCTPCT in the last 72 hours. Urine analysis:    Component Value Date/Time   COLORURINE YELLOW 05/30/2021 1948   APPEARANCEUR CLEAR 05/30/2021 1948   LABSPEC 1.018 05/30/2021 1948   PHURINE 7.0 05/30/2021 1948   GLUCOSEU NEGATIVE 05/30/2021 1948   HGBUR NEGATIVE 05/30/2021 1948   BILIRUBINUR NEGATIVE 05/30/2021 1948   KETONESUR NEGATIVE 05/30/2021 1948   PROTEINUR NEGATIVE 05/30/2021 1948   NITRITE NEGATIVE 05/30/2021 1948   LEUKOCYTESUR NEGATIVE 05/30/2021 1948    Radiological Exams on Admission - Personally Reviewed: CT HEAD WO CONTRAST  Result Date: 05/30/2021 CLINICAL DATA:  Head trauma due to fall EXAM: CT HEAD WITHOUT CONTRAST TECHNIQUE: Contiguous axial images were obtained from the base of the skull through the vertex without intravenous contrast. COMPARISON:  None. FINDINGS: Brain: No evidence of acute infarction, hemorrhage, hydrocephalus, extra-axial  collection or mass lesion/mass effect. Mild low-density changes within the periventricular and subcortical white matter compatible with chronic microvascular ischemic change. Mild diffuse cerebral volume loss. Vascular: Atherosclerotic calcifications involving the large vessels of the skull base. No unexpected hyperdense vessel. Skull: Normal. Negative for fracture or focal lesion. Sinuses/Orbits: There is opacification within a posterior right ethmoid air cell. Otherwise negative. Other: Negative for scalp hematoma. IMPRESSION: 1. No acute intracranial findings. 2. Mild chronic microvascular ischemic change and cerebral volume loss. Electronically Signed   By: Davina Poke D.O.   On: 05/30/2021 18:36  DG Chest Portable 1 View  Result Date: 05/30/2021 CLINICAL DATA:  Concern for cardiomegaly and pulmonary edema. EXAM: PORTABLE CHEST 1 VIEW COMPARISON:  Chest radiograph dated 02/15/2017. FINDINGS: The heart size and mediastinal contours are within normal limits. Both lungs are clear. Degenerative changes are seen in the spine. IMPRESSION: No active disease. Electronically Signed   By: Zerita Boers M.D.   On: 05/30/2021 16:08   CT Angio Chest/Abd/Pel for Dissection W and/or Wo Contrast  Result Date: 05/30/2021 CLINICAL DATA:  Abdominal pain, aortic dissection suspected Upper abdominal pain and blood in stool. Dizziness and syncopal episode. EXAM: CT ANGIOGRAPHY CHEST, ABDOMEN AND PELVIS TECHNIQUE: Non-contrast CT of the chest was initially obtained. Multidetector CT imaging through the chest, abdomen and pelvis was performed using the standard protocol during bolus administration of intravenous contrast. Multiplanar reconstructed images and MIPs were obtained and reviewed to evaluate the vascular anatomy. CONTRAST:  114mL OMNIPAQUE IOHEXOL 350 MG/ML SOLN COMPARISON:  Chest radiograph earlier today. Abdominal ultrasound 04/17/2020 abdominal CT a 12/13/2018 FINDINGS: CTA CHEST FINDINGS Cardiovascular: No  aortic aneurysm. Moderate atherosclerotic calcifications throughout the aorta without dissection or acute aortic findings. No aortic hematoma noncontrast exam. There is no central pulmonary embolus to the segmental level. Heart is normal in size. Coronary artery calcifications. No pericardial effusion. Mediastinum/Nodes: 8 mm right hilar lymph node. No enlarged mediastinal or left hilar lymph nodes. Decompressed esophagus. No thyroid nodule. No supraclavicular or axillary adenopathy. Lungs/Pleura: Lobulated peripheral right upper lobe pulmonary nodule measures 13 x 11 mm, series 8, image 21. Spiculated anterior right upper lobe nodule measures 2.0 x 2.3 cm, series 8, image 43. This nodule abuts the minor fissure and lateral aspect of the mediastinum. Minimal apical emphysema. No pneumonia or pulmonary edema. No pleural fluid. Musculoskeletal: Remote healed sternal fracture. Thoracic spondylosis with anterior spurring. No evidence of focal bone lesion or acute osseous abnormality. Review of the MIP images confirms the above findings. CTA ABDOMEN AND PELVIS FINDINGS VASCULAR Aorta: Moderate atherosclerosis. No aneurysm or dissection. No significant stenosis. No vasculitis. Celiac: Patent without evidence of aneurysm, dissection, vasculitis or significant stenosis. Mild plaque at the origin. Distal branch vessels are patent. SMA: Patent without evidence of aneurysm, dissection, vasculitis or significant stenosis. Distal branches are patent. Renals: 2 right and 2 left renal arteries. All renal arteries are patent without stenosis or acute findings. IMA: Patent. Inflow: Patent without evidence of aneurysm, dissection, vasculitis or significant stenosis. Veins: No portal venous or mesenteric gas. No obvious venous abnormality on this arterial phase exam. Review of the MIP images confirms the above findings. NON-VASCULAR Hepatobiliary: Cholecystectomy. No focal hepatic abnormality on arterial phase imaging. Pancreas: No  ductal dilatation or inflammation. Spleen: Normal in size and arterial enhancement. Adrenals/Urinary Tract: 15 mm left adrenal nodule is stable from prior exam. Normal right adrenal gland. There are bilateral peripelvic cysts. No hydronephrosis or perinephric edema. Homogeneous renal enhancement. Unremarkable urinary bladder. Stomach/Bowel: Moderate length segment of fluid-filled small bowel with wall thickening and enhancement involving the mid left abdominal bowel loops, for example series 6, image 139. There is minimal adjacent mesenteric edema. No pneumatosis. No terminal ileal involvement. Ingested material including likely pills in the stomach. Moderate volume of stool throughout the colon without colonic wall thickening or hyperemia. Sigmoid colon is redundant. No pericolonic inflammation. Lymphatic: Small central mesenteric lymph nodes not enlarged by size criteria. No retroperitoneal adenopathy. No pelvic adenopathy. Reproductive: Prostate is unremarkable. Other: There is trace free fluid in the dependent pelvis, series 6, image 180. No  free air or focal fluid collection. Small fat containing umbilical hernia. Musculoskeletal: There are no acute or suspicious osseous abnormalities. Multilevel degenerative change in the lumbar spine. Scattered degenerative subchondral cysts and bone islands in the pelvis. Review of the MIP images confirms the above findings. IMPRESSION: Vascular: 1. Thoracoabdominal aortic atherosclerosis without dissection, aneurysm, or acute vascular findings. 2. Patent mesenteric arteries and aortic branch vessels. Nonvascular: 1. Two separate nodules in the right upper lobe, both of which are suspicious for malignancy, either primary or metastatic. Larger nodule is spiculated measuring 2.0 x 2.3 cm, smaller nodule measures 1.3 x 1.1 cm. Recommend oncologic workup. 2. Moderate length segment of fluid-filled small bowel with wall thickening and enhancement involving the mid left abdominal  bowel loops with minimal adjacent mesenteric edema, suspicious for enteritis. Similar findings are seen on 2020 CT. No evidence of obstruction, perforation, or abscess. 3. Stable left adrenal nodule, likely adenoma. 4. Bilateral peripelvic cysts. Aortic Atherosclerosis (ICD10-I70.0) and Emphysema (ICD10-J43.9). Electronically Signed   By: Keith Rake M.D.   On: 05/30/2021 18:48    EKG: Personally reviewed.  Rhythm is normal sinus rhythm with heart rate of 87 bpm.  No dynamic ST segment changes appreciated.  Assessment/Plan Principal Problem:   Syncope  Patient experiencing an episode of syncope with prodrome of lightheadedness EKG and troponin unremarkable all upon arrival here to the emergency department.  Patient is normotensive or slightly hypertensive. Considering patient's 2-day history of abdominal discomfort and frequent bouts of loose stools syncope may have been secondary to orthostasis or possibly vasovagal syncope. Monitoring patient telemetry Obtaining orthostatic vital signs Review of medications reveals no evidence of diuretic use Gentle intravenous hydration with isotonic fluids Echocardiogram in the morning Supportive care for concurrent gastrointestinal illness  Active Problems:   Acute diarrhea  Patient has been experiencing abdominal pain with frequent loose stools over the past 2 days, resulting in 1 episode of concurrent hematochezia Exam reveals some generalized tenderness, worst in the epigastric region Some fluid-filled loops of small bowel concerning for enteritis on CT imaging Likely gastroenteritis although due to patient's epigastric tenderness we will switch patient from H2 blocker to proton pump inhibitor Gentle intravenous hydration Supportive care otherwise   Hematochezia  Patient experienced 1 episode of hematochezia on 8/12 Hemoglobin has been stable since with no episodes of recurrence Patient should follow-up with gastroenterology in the  outpatient setting for consideration of endoscopic evaluation    GERD (gastroesophageal reflux disease)  Switching patient from H2 blocker to PPI    Mixed hyperlipidemia  Continue home regimen of statin therapy    Hypothyroidism  Continue levothyroxine    Nodule of upper lobe of right lung  2 spiculated nodules of the right upper lung are concerning for possible malignancy Patient will need to be referred to appropriate lung nodule clinic at time of discharge for prompt evaluation  Hyponatremia  Patient exhibiting mild hyponatremia of 129. This is seemingly chronic as evidenced by redemonstration of mild hyponatremia for at least the past 5 years upon review of labs Will obtain serum osmolality, urine osmolality and urine sodium   Code Status:  Full code Family Communication: Son is at bedside who has been updated on plan of care  Status is: Observation  The patient remains OBS appropriate and will d/c before 2 midnights.  Dispo: The patient is from: Home              Anticipated d/c is to: Home  Patient currently is not medically stable to d/c.   Difficult to place patient No        Vernelle Emerald MD [facility] Pager [phone]  If 7PM-7AM, please contact night-coverage [email or web address] Use universal [place] password for that web site. If you do not have the password, please call the hospital operator.  05/30/2021, 9:16 PM

## 2021-05-30 NOTE — ED Notes (Signed)
Patient transported to CT 

## 2021-05-31 ENCOUNTER — Other Ambulatory Visit: Payer: Self-pay

## 2021-05-31 ENCOUNTER — Observation Stay (HOSPITAL_BASED_OUTPATIENT_CLINIC_OR_DEPARTMENT_OTHER): Payer: Medicare HMO

## 2021-05-31 DIAGNOSIS — K219 Gastro-esophageal reflux disease without esophagitis: Secondary | ICD-10-CM | POA: Diagnosis not present

## 2021-05-31 DIAGNOSIS — R197 Diarrhea, unspecified: Secondary | ICD-10-CM | POA: Diagnosis not present

## 2021-05-31 DIAGNOSIS — R55 Syncope and collapse: Secondary | ICD-10-CM | POA: Diagnosis not present

## 2021-05-31 DIAGNOSIS — R911 Solitary pulmonary nodule: Secondary | ICD-10-CM | POA: Diagnosis not present

## 2021-05-31 DIAGNOSIS — K921 Melena: Secondary | ICD-10-CM | POA: Diagnosis not present

## 2021-05-31 DIAGNOSIS — E871 Hypo-osmolality and hyponatremia: Secondary | ICD-10-CM | POA: Diagnosis not present

## 2021-05-31 DIAGNOSIS — E039 Hypothyroidism, unspecified: Secondary | ICD-10-CM | POA: Diagnosis not present

## 2021-05-31 DIAGNOSIS — E782 Mixed hyperlipidemia: Secondary | ICD-10-CM | POA: Diagnosis not present

## 2021-05-31 LAB — CBC WITH DIFFERENTIAL/PLATELET
Abs Immature Granulocytes: 0.02 10*3/uL (ref 0.00–0.07)
Basophils Absolute: 0 10*3/uL (ref 0.0–0.1)
Basophils Relative: 0 %
Eosinophils Absolute: 0.1 10*3/uL (ref 0.0–0.5)
Eosinophils Relative: 1 %
HCT: 36.4 % — ABNORMAL LOW (ref 39.0–52.0)
Hemoglobin: 12.5 g/dL — ABNORMAL LOW (ref 13.0–17.0)
Immature Granulocytes: 0 %
Lymphocytes Relative: 23 %
Lymphs Abs: 1.6 10*3/uL (ref 0.7–4.0)
MCH: 32.1 pg (ref 26.0–34.0)
MCHC: 34.3 g/dL (ref 30.0–36.0)
MCV: 93.3 fL (ref 80.0–100.0)
Monocytes Absolute: 1.7 10*3/uL — ABNORMAL HIGH (ref 0.1–1.0)
Monocytes Relative: 24 %
Neutro Abs: 3.7 10*3/uL (ref 1.7–7.7)
Neutrophils Relative %: 52 %
Platelets: 256 10*3/uL (ref 150–400)
RBC: 3.9 MIL/uL — ABNORMAL LOW (ref 4.22–5.81)
RDW: 12.7 % (ref 11.5–15.5)
WBC: 7.1 10*3/uL (ref 4.0–10.5)
nRBC: 0 % (ref 0.0–0.2)

## 2021-05-31 LAB — COMPREHENSIVE METABOLIC PANEL
ALT: 13 U/L (ref 0–44)
AST: 18 U/L (ref 15–41)
Albumin: 2.6 g/dL — ABNORMAL LOW (ref 3.5–5.0)
Alkaline Phosphatase: 34 U/L — ABNORMAL LOW (ref 38–126)
Anion gap: 7 (ref 5–15)
BUN: 13 mg/dL (ref 8–23)
CO2: 24 mmol/L (ref 22–32)
Calcium: 8.4 mg/dL — ABNORMAL LOW (ref 8.9–10.3)
Chloride: 101 mmol/L (ref 98–111)
Creatinine, Ser: 0.8 mg/dL (ref 0.61–1.24)
GFR, Estimated: >60 mL/min (ref >60)
Glucose, Bld: 118 mg/dL — ABNORMAL HIGH (ref 70–99)
Potassium: 3.5 mmol/L (ref 3.5–5.1)
Sodium: 132 mmol/L — ABNORMAL LOW (ref 135–145)
Total Bilirubin: 0.4 mg/dL (ref 0.3–1.2)
Total Protein: 4.6 g/dL — ABNORMAL LOW (ref 6.5–8.1)

## 2021-05-31 LAB — SODIUM, URINE, RANDOM: Sodium, Ur: 48 mmol/L

## 2021-05-31 LAB — OSMOLALITY, URINE: Osmolality, Ur: 402 mOsm/kg (ref 300–900)

## 2021-05-31 LAB — ECHOCARDIOGRAM COMPLETE
Area-P 1/2: 3.03 cm2
Height: 69 in
S' Lateral: 2.9 cm
Weight: 2608 oz

## 2021-05-31 LAB — MAGNESIUM: Magnesium: 1.7 mg/dL (ref 1.7–2.4)

## 2021-05-31 LAB — CBG MONITORING, ED: Glucose-Capillary: 92 mg/dL (ref 70–99)

## 2021-05-31 LAB — OSMOLALITY: Osmolality: 272 mOsm/kg — ABNORMAL LOW (ref 275–295)

## 2021-05-31 NOTE — ED Notes (Signed)
Lunch tray ordered 

## 2021-05-31 NOTE — Evaluation (Signed)
Physical Therapy Evaluation Patient Details Name: Mark Ewing MRN: [id number] DOB: 1941-05-20 Today's Date: 05/31/2021   History of Present Illness  80 year old male presenting to [facility] emergency department after experiencing an episode of syncope. with past medical history of gastroesophageal reflux disease, hypothyroidism, hypertension, hyperlipidemia  Clinical Impression  Patient evaluated by Physical Therapy with no further acute PT needs identified. All education has been completed and the patient has no further questions.  See below for any follow-up Physical Therapy or equipment needs. PT is signing off. Thank you for this referral.     Follow Up Recommendations No PT follow up    Equipment Recommendations  None recommended by PT    Recommendations for Other Services       Precautions / Restrictions Precautions Precautions: None      Mobility  Bed Mobility Overal bed mobility: Independent                  Transfers Overall transfer level: Independent                  Ambulation/Gait Ambulation/Gait assistance: Independent Gait Distance (Feet): 200 Feet Assistive device: None Gait Pattern/deviations: Step-through pattern     General Gait Details: Reports walking feels no different than his normal walking  Stairs            Wheelchair Mobility    Modified Rankin (Stroke Patients Only)       Balance Overall balance assessment: Mild deficits observed, not formally tested                                           Pertinent Vitals/Pain Pain Assessment: No/denies pain    Home Living Family/patient expects to be discharged to:: Private residence Living Arrangements: Spouse/significant other Available Help at Discharge: Family Type of Home: House Home Access: Stairs to enter   Technical brewer of Steps: 3 Home Layout: One level        Prior Function Level of Independence: Independent          Comments: enjoys gardening     Hand Dominance        Extremity/Trunk Assessment   Upper Extremity Assessment Upper Extremity Assessment: Overall WFL for tasks assessed    Lower Extremity Assessment Lower Extremity Assessment: Overall WFL for tasks assessed       Communication   Communication: No difficulties  Cognition Arousal/Alertness: Awake/alert Behavior During Therapy: WFL for tasks assessed/performed Overall Cognitive Status: Within Functional Limits for tasks assessed                                        General Comments General comments (skin integrity, edema, etc.): Orhtostatic BPs demonstrated no SBP drop with standing and walking    Exercises     Assessment/Plan    PT Assessment Patent does not need any further PT services  PT Problem List         PT Treatment Interventions      PT Goals (Current goals can be found in the Care Plan section)  Acute Rehab PT Goals Patient Stated Goal: Home soon PT Goal Formulation: All assessment and education complete, DC therapy    Frequency     Barriers to discharge        Co-evaluation  AM-PAC PT "6 Clicks" Mobility  Outcome Measure Help needed turning from your back to your side while in a flat bed without using bedrails?: None Help needed moving from lying on your back to sitting on the side of a flat bed without using bedrails?: None Help needed moving to and from a bed to a chair (including a wheelchair)?: None Help needed standing up from a chair using your arms (e.g., wheelchair or bedside chair)?: None Help needed to walk in hospital room?: None Help needed climbing 3-5 steps with a railing? : None 6 Click Score: 24    End of Session   Activity Tolerance: Patient tolerated treatment well Patient left: in bed;with call bell/phone within reach;with family/visitor present Nurse Communication: Mobility status PT Visit Diagnosis: Other abnormalities  of gait and mobility (R26.89)    Time: 2585-2778 PT Time Calculation (min) (ACUTE ONLY): 18 min   Charges:   PT Evaluation $PT Eval Low Complexity: [place], PT  Acute Rehabilitation Services Pager [phone] Office [place] 05/31/2021, 3:28 PM

## 2021-05-31 NOTE — ED Notes (Signed)
Provider at the bedside.  

## 2021-05-31 NOTE — ED Notes (Signed)
Pt ambulated to the restroom with steady gait. 

## 2021-05-31 NOTE — ED Notes (Signed)
Breakfast Ordered 

## 2021-05-31 NOTE — ED Notes (Signed)
Received verbal report from Paden B RN at this time 

## 2021-05-31 NOTE — Discharge Summary (Signed)
Physician Discharge Summary  JAQUES Ewing RXV:[id number] DOB: 16-Oct-1941 DOA: 05/30/2021  PCP: Antony Contras, MD  Admit date: 05/30/2021 Discharge date: 05/31/2021  Admitted From: Home  Discharge disposition: Home  Recommendations for Outpatient Follow-Up:   Follow up with your primary care provider in one week.  Check CBC, BMP, magnesium in the next visit Patient has 2 spiculated nodules over the right upper lung concerning for possible malignancy.  Will need to follow-up with pulmonary as outpatient since patient might need biopsy/PET scan Patient states that he has been having some diarrhea episode as outpatient.  This will need to be addressed with his primary gastroenterologist.   Discharge Diagnosis:   Principal Problem:   Syncope Active Problems:   GERD (gastroesophageal reflux disease)   Mixed hyperlipidemia   Hypothyroidism   Acute diarrhea   Hematochezia   Nodule of upper lobe of right lung   Hyponatremia  Discharge Condition: Improved.  Diet recommendation: Low sodium, heart healthy.    Wound care: None.  Code status: Full.  History of Present Illness:   80 year old male with past medical history of GERD, hypothyroidism, hypertension, hyperlipidemia presented to the hospital after sustaining syncope.  Patient did have some epigastric pain and had 1 episode of bright red blood for which he had come to the hospital..  Patient was noted to have a stable hemoglobin when he initially presented to the ED and was sent home but after he went home he continued to have abdominal cramping pain and has generalized weakness and subsequently fell down which prompted him to come back to the hospital again.  In the ED, patient was noted to be volume depleted.CT angiogram of the chest abdomen and pelvis was performed which revealed no evidence of dissection but did reveal the incidental finding of 2 nodules in the right upper lung with spiculated features concerning for  possible malignancy.  Patient additionally found to have moderate fluid-filled small bowel loops concerning for enteritis.  Patient was additionally found to have mild hyponatremia.  Hospitalist group was then called to assess the patient for admission to the hospital.  Hospital Course:   Following conditions were addressed during hospitalization as listed below,  Syncope with falls.  Preceded by lightheadedness.  To be secondary to volume depletion.  EKG and troponin unremarkable.  Patient did have some loose stools abdominal discomfort and there is possibility of vasovagal syncope and orthostasis.  Orthostatic vitals were obtained which were negative.  2D echocardiogram with preserved LV function of 55 to 60%.  The monitor did not show any arrhythmias.    Acute diarrhea with volume depletion. CT scan showing enteritis.  Patient does not have active diarrhea at this time.  He does have history of diarrhea in the past and had seen GI in as well.  I have encouraged him to follow-up with GI as outpatient.   Hematochezia 1 episode of hematochezia on 8/12.  Hemoglobin stable.  Will need outpatient follow-up with GI.Marland Kitchen     GERD  Continue H2 blockers.     Mixed hyperlipidemia Continue simvastatin     Hypothyroidism Continue Synthroid     Nodule of upper lobe of right lung  2 spiculated nodules of the right upper lung are concerning for possible malignancy.  Will need follow-up at the lung nodule clinic.   Chronic hyponatremia  Patient exhibiting mild hyponatremia at 129. serum osmolality borderline low., urine osmolality at 402.  And urine sodium at 42.   Disposition.  At this time, patient  is stable for disposition home with outpatient PCP follow-up.  She will benefit from pulmonary and GI follow-up as outpatient.  Medical Consultants:   None.  Procedures:    None Subjective:   Today, patient was seen and examined at bedside.  Patient feels better today.  Was able to ambulate with  physical therapy and did not have any dizziness lightheadedness.  Wishes to go home.  Patient's wife at bedside.  Discharge Exam:   Vitals:   05/31/21 1430 05/31/21 1443  BP: (!) 168/75   Pulse: 66   Resp: 17   Temp:  98.4 F (36.9 C)  SpO2: 98%    Vitals:   05/31/21 1330 05/31/21 1400 05/31/21 1430 05/31/21 1443  BP: (!) 150/77 (!) 167/73 (!) 168/75   Pulse: 75 70 66   Resp: 13 18 17    Temp:    98.4 F (36.9 C)  TempSrc:    Oral  SpO2: 99% 98% 98%   Weight:      Height:        General: Alert awake, not in obvious distress HENT: pupils equally reacting to light,  No scleral pallor or icterus noted. Oral mucosa is moist.  Chest:  Clear breath sounds.  Diminished breath sounds bilaterally. No crackles or wheezes.  CVS: S1 &S2 heard. No murmur.  Regular rate and rhythm. Abdomen: Soft, nontender, nondistended.  Bowel sounds are heard.   Extremities: No cyanosis, clubbing or edema.  Peripheral pulses are palpable. Psych: Alert, awake and oriented, normal mood CNS:  No cranial nerve deficits.  Power equal in all extremities.   Skin: Warm and dry.  No rashes noted.  The results of significant diagnostics from this hospitalization (including imaging, microbiology, ancillary and laboratory) are listed below for reference.     Diagnostic Studies:   CT HEAD WO CONTRAST  Result Date: 05/30/2021 CLINICAL DATA:  Head trauma due to fall EXAM: CT HEAD WITHOUT CONTRAST TECHNIQUE: Contiguous axial images were obtained from the base of the skull through the vertex without intravenous contrast. COMPARISON:  None. FINDINGS: Brain: No evidence of acute infarction, hemorrhage, hydrocephalus, extra-axial collection or mass lesion/mass effect. Mild low-density changes within the periventricular and subcortical white matter compatible with chronic microvascular ischemic change. Mild diffuse cerebral volume loss. Vascular: Atherosclerotic calcifications involving the large vessels of the skull base.  No unexpected hyperdense vessel. Skull: Normal. Negative for fracture or focal lesion. Sinuses/Orbits: There is opacification within a posterior right ethmoid air cell. Otherwise negative. Other: Negative for scalp hematoma. IMPRESSION: 1. No acute intracranial findings. 2. Mild chronic microvascular ischemic change and cerebral volume loss. Electronically Signed   By: Davina Poke D.O.   On: 05/30/2021 18:36   DG Chest Portable 1 View  Result Date: 05/30/2021 CLINICAL DATA:  Concern for cardiomegaly and pulmonary edema. EXAM: PORTABLE CHEST 1 VIEW COMPARISON:  Chest radiograph dated 02/15/2017. FINDINGS: The heart size and mediastinal contours are within normal limits. Both lungs are clear. Degenerative changes are seen in the spine. IMPRESSION: No active disease. Electronically Signed   By: Zerita Boers M.D.   On: 05/30/2021 16:08   ECHOCARDIOGRAM COMPLETE  Result Date: 05/31/2021    ECHOCARDIOGRAM REPORT   Patient Name:   Mark Ewing Date of Exam: 05/31/2021 Medical Rec #:  [id number]         Height:       69.0 in Accession #:    [id number]        Weight:       163.0  lb Date of Birth:  1941-04-19         BSA:          1.894 m Patient Age:    38 years          BP:           145/81 mmHg Patient Gender: M                 HR:           71 bpm. Exam Location:  Inpatient Procedure: 2D Echo Indications:    syncope  History:        Patient has no prior history of Echocardiogram examinations.                 Risk Factors:Hypertension and Dyslipidemia.  Sonographer:    Johny Chess RDCS Referring Phys: [id number] Sherryll Burger [facility]  Sonographer Comments: Image acquisition challenging due to respiratory motion. IMPRESSIONS  1. Left ventricular ejection fraction, by estimation, is 55 to 60%. The left ventricle has normal function. The left ventricle has no regional wall motion abnormalities. Left ventricular diastolic parameters are consistent with Grade I diastolic dysfunction (impaired relaxation).  2.  Right ventricular systolic function is normal. The right ventricular size is normal. Tricuspid regurgitation signal is inadequate for assessing PA pressure.  3. The mitral valve is grossly normal. No evidence of mitral valve regurgitation. No evidence of mitral stenosis.  4. The aortic valve is tricuspid. There is mild calcification of the aortic valve. Aortic valve regurgitation is not visualized. Mild aortic valve sclerosis is present, with no evidence of aortic valve stenosis. Conclusion(s)/Recommendation(s): Normal biventricular function without evidence of hemodynamically significant valvular heart disease. FINDINGS  Left Ventricle: Left ventricular ejection fraction, by estimation, is 55 to 60%. The left ventricle has normal function. The left ventricle has no regional wall motion abnormalities. The left ventricular internal cavity size was normal in size. There is  no left ventricular hypertrophy. Left ventricular diastolic parameters are consistent with Grade I diastolic dysfunction (impaired relaxation). Right Ventricle: The right ventricular size is normal. No increase in right ventricular wall thickness. Right ventricular systolic function is normal. Tricuspid regurgitation signal is inadequate for assessing PA pressure. Left Atrium: Left atrial size was normal in size. Right Atrium: Right atrial size was normal in size. Pericardium: There is no evidence of pericardial effusion. Presence of pericardial fat pad. Mitral Valve: The mitral valve is grossly normal. No evidence of mitral valve regurgitation. No evidence of mitral valve stenosis. Tricuspid Valve: The tricuspid valve is grossly normal. Tricuspid valve regurgitation is not demonstrated. No evidence of tricuspid stenosis. Aortic Valve: The aortic valve is tricuspid. There is mild calcification of the aortic valve. Aortic valve regurgitation is not visualized. Mild aortic valve sclerosis is present, with no evidence of aortic valve stenosis.  Pulmonic Valve: The pulmonic valve was grossly normal. Pulmonic valve regurgitation is trivial. No evidence of pulmonic stenosis. Aorta: The aortic root and ascending aorta are structurally normal, with no evidence of dilitation. Venous: The inferior vena cava was not well visualized. IAS/Shunts: The atrial septum is grossly normal.  LEFT VENTRICLE PLAX 2D LVIDd:         4.10 cm  Diastology LVIDs:         2.90 cm  LV e' medial:    6.85 cm/s LV PW:         0.90 cm  LV E/e' medial:  10.4 LV IVS:        0.80 cm  LV e' lateral:   9.36 cm/s LVOT diam:     2.20 cm  LV E/e' lateral: 7.6 LV SV:         65 LV SV Index:   34 LVOT Area:     3.80 cm  RIGHT VENTRICLE RV S prime:     10.40 cm/s TAPSE (M-mode): 2.3 cm LEFT ATRIUM             Index       RIGHT ATRIUM           Index LA diam:        4.10 cm 2.16 cm/m  RA Area:     14.50 cm LA Vol (A2C):   48.9 ml 25.82 ml/m RA Volume:   36.20 ml  19.11 ml/m LA Vol (A4C):   49.9 ml 26.35 ml/m LA Biplane Vol: 54.7 ml 28.88 ml/m  AORTIC VALVE LVOT Vmax:   78.40 cm/s LVOT Vmean:  50.100 cm/s LVOT VTI:    0.171 m  AORTA Ao Root diam: 3.40 cm Ao Asc diam:  3.00 cm MITRAL VALVE MV Area (PHT): 3.03 cm    SHUNTS MV Decel Time: 250 msec    Systemic VTI:  0.17 m MV E velocity: 71.00 cm/s  Systemic Diam: 2.20 cm MV A velocity: 92.70 cm/s MV E/A ratio:  0.77 Eleonore Chiquito MD Electronically signed by Eleonore Chiquito MD Signature Date/Time: 05/31/2021/9:52:51 AM    Final    CT Angio Chest/Abd/Pel for Dissection W and/or Wo Contrast  Result Date: 05/30/2021 CLINICAL DATA:  Abdominal pain, aortic dissection suspected Upper abdominal pain and blood in stool. Dizziness and syncopal episode. EXAM: CT ANGIOGRAPHY CHEST, ABDOMEN AND PELVIS TECHNIQUE: Non-contrast CT of the chest was initially obtained. Multidetector CT imaging through the chest, abdomen and pelvis was performed using the standard protocol during bolus administration of intravenous contrast. Multiplanar reconstructed images and  MIPs were obtained and reviewed to evaluate the vascular anatomy. CONTRAST:  172mL OMNIPAQUE IOHEXOL 350 MG/ML SOLN COMPARISON:  Chest radiograph earlier today. Abdominal ultrasound 04/17/2020 abdominal CT a 12/13/2018 FINDINGS: CTA CHEST FINDINGS Cardiovascular: No aortic aneurysm. Moderate atherosclerotic calcifications throughout the aorta without dissection or acute aortic findings. No aortic hematoma noncontrast exam. There is no central pulmonary embolus to the segmental level. Heart is normal in size. Coronary artery calcifications. No pericardial effusion. Mediastinum/Nodes: 8 mm right hilar lymph node. No enlarged mediastinal or left hilar lymph nodes. Decompressed esophagus. No thyroid nodule. No supraclavicular or axillary adenopathy. Lungs/Pleura: Lobulated peripheral right upper lobe pulmonary nodule measures 13 x 11 mm, series 8, image 21. Spiculated anterior right upper lobe nodule measures 2.0 x 2.3 cm, series 8, image 43. This nodule abuts the minor fissure and lateral aspect of the mediastinum. Minimal apical emphysema. No pneumonia or pulmonary edema. No pleural fluid. Musculoskeletal: Remote healed sternal fracture. Thoracic spondylosis with anterior spurring. No evidence of focal bone lesion or acute osseous abnormality. Review of the MIP images confirms the above findings. CTA ABDOMEN AND PELVIS FINDINGS VASCULAR Aorta: Moderate atherosclerosis. No aneurysm or dissection. No significant stenosis. No vasculitis. Celiac: Patent without evidence of aneurysm, dissection, vasculitis or significant stenosis. Mild plaque at the origin. Distal branch vessels are patent. SMA: Patent without evidence of aneurysm, dissection, vasculitis or significant stenosis. Distal branches are patent. Renals: 2 right and 2 left renal arteries. All renal arteries are patent without stenosis or acute findings. IMA: Patent. Inflow: Patent without evidence of aneurysm, dissection, vasculitis or significant stenosis.  Veins: No portal venous or  mesenteric gas. No obvious venous abnormality on this arterial phase exam. Review of the MIP images confirms the above findings. NON-VASCULAR Hepatobiliary: Cholecystectomy. No focal hepatic abnormality on arterial phase imaging. Pancreas: No ductal dilatation or inflammation. Spleen: Normal in size and arterial enhancement. Adrenals/Urinary Tract: 15 mm left adrenal nodule is stable from prior exam. Normal right adrenal gland. There are bilateral peripelvic cysts. No hydronephrosis or perinephric edema. Homogeneous renal enhancement. Unremarkable urinary bladder. Stomach/Bowel: Moderate length segment of fluid-filled small bowel with wall thickening and enhancement involving the mid left abdominal bowel loops, for example series 6, image 139. There is minimal adjacent mesenteric edema. No pneumatosis. No terminal ileal involvement. Ingested material including likely pills in the stomach. Moderate volume of stool throughout the colon without colonic wall thickening or hyperemia. Sigmoid colon is redundant. No pericolonic inflammation. Lymphatic: Small central mesenteric lymph nodes not enlarged by size criteria. No retroperitoneal adenopathy. No pelvic adenopathy. Reproductive: Prostate is unremarkable. Other: There is trace free fluid in the dependent pelvis, series 6, image 180. No free air or focal fluid collection. Small fat containing umbilical hernia. Musculoskeletal: There are no acute or suspicious osseous abnormalities. Multilevel degenerative change in the lumbar spine. Scattered degenerative subchondral cysts and bone islands in the pelvis. Review of the MIP images confirms the above findings. IMPRESSION: Vascular: 1. Thoracoabdominal aortic atherosclerosis without dissection, aneurysm, or acute vascular findings. 2. Patent mesenteric arteries and aortic branch vessels. Nonvascular: 1. Two separate nodules in the right upper lobe, both of which are suspicious for malignancy,  either primary or metastatic. Larger nodule is spiculated measuring 2.0 x 2.3 cm, smaller nodule measures 1.3 x 1.1 cm. Recommend oncologic workup. 2. Moderate length segment of fluid-filled small bowel with wall thickening and enhancement involving the mid left abdominal bowel loops with minimal adjacent mesenteric edema, suspicious for enteritis. Similar findings are seen on 2020 CT. No evidence of obstruction, perforation, or abscess. 3. Stable left adrenal nodule, likely adenoma. 4. Bilateral peripelvic cysts. Aortic Atherosclerosis (ICD10-I70.0) and Emphysema (ICD10-J43.9). Electronically Signed   By: Keith Rake M.D.   On: 05/30/2021 18:48     Labs:   Basic Metabolic Panel: Recent Labs  Lab 05/29/21 1556 05/30/21 1344 05/31/21 0113  NA 131* 129* 132*  K 4.9 3.9 3.5  CL 96* 95* 101  CO2 27 24 24   GLUCOSE 125* 136* 118*  BUN 19 18 13   CREATININE 1.11 0.99 0.80  CALCIUM 9.0 8.7* 8.4*  MG  --   --  1.7   GFR Estimated Creatinine Clearance: 74.9 mL/min (by C-G formula based on SCr of 0.8 mg/dL). Liver Function Tests: Recent Labs  Lab 05/29/21 1556 05/30/21 1344 05/31/21 0113  AST 34 23 18  ALT 14 13 13   ALKPHOS 38 34* 34*  BILITOT 1.3* 1.0 0.4  PROT 5.7* 5.5* 4.6*  ALBUMIN 3.6 3.1* 2.6*   Recent Labs  Lab 05/29/21 1556  LIPASE 22   No results for input(s): AMMONIA in the last 168 hours. Coagulation profile No results for input(s): INR, PROTIME in the last 168 hours.  CBC: Recent Labs  Lab 05/29/21 1556 05/29/21 1959 05/29/21 2130 05/30/21 1344 05/31/21 0113  WBC 11.9*  --   --  9.9 7.1  NEUTROABS 9.4*  --   --  8.0* 3.7  HGB 14.6 13.5 13.5 14.1 12.5*  HCT 42.3 39.8 40.6 40.2 36.4*  MCV 94.6  --   --  93.7 93.3  PLT 332  --   --  274 256  Cardiac Enzymes: No results for input(s): CKTOTAL, CKMB, CKMBINDEX, TROPONINI in the last 168 hours. BNP: Invalid input(s): POCBNP CBG: Recent Labs  Lab 05/30/21 1343 05/31/21 0526  GLUCAP 144* 92    D-Dimer No results for input(s): DDIMER in the last 72 hours. Hgb A1c No results for input(s): HGBA1C in the last 72 hours. Lipid Profile No results for input(s): CHOL, HDL, LDLCALC, TRIG, CHOLHDL, LDLDIRECT in the last 72 hours. Thyroid function studies No results for input(s): TSH, T4TOTAL, T3FREE, THYROIDAB in the last 72 hours.  Invalid input(s): FREET3 Anemia work up No results for input(s): VITAMINB12, FOLATE, FERRITIN, TIBC, IRON, RETICCTPCT in the last 72 hours. Microbiology Recent Results (from the past 240 hour(s))  Resp Panel by RT-PCR (Flu A&B, Covid) Nasopharyngeal Swab     Status: None   Collection Time: 05/30/21  3:39 PM   Specimen: Nasopharyngeal Swab; Nasopharyngeal(NP) swabs in vial transport medium  Result Value Ref Range Status   SARS Coronavirus 2 by RT PCR NEGATIVE NEGATIVE Final    Comment: (NOTE) SARS-CoV-2 target nucleic acids are NOT DETECTED.  The SARS-CoV-2 RNA is generally detectable in upper respiratory specimens during the acute phase of infection. The lowest concentration of SARS-CoV-2 viral copies this assay can detect is 138 copies/mL. A negative result does not preclude SARS-Cov-2 infection and should not be used as the sole basis for treatment or other patient management decisions. A negative result may occur with  improper specimen collection/handling, submission of specimen other than nasopharyngeal swab, presence of viral mutation(s) within the areas targeted by this assay, and inadequate number of viral copies(<138 copies/mL). A negative result must be combined with clinical observations, patient history, and epidemiological information. The expected result is Negative.  Fact Sheet for Patients:  EntrepreneurPulse.com.au  Fact Sheet for Healthcare Providers:  IncredibleEmployment.be  This test is no t yet approved or cleared by the [place] FDA and  has been authorized for detection and/or  diagnosis of SARS-CoV-2 by FDA under an Emergency Use Authorization (EUA). This EUA will remain  in effect (meaning this test can be used) for the duration of the COVID-19 declaration under Section 564(b)(1) of the Act, 21 U.S.C.section 360bbb-3(b)(1), unless the authorization is terminated  or revoked sooner.       Influenza A by PCR NEGATIVE NEGATIVE Final   Influenza B by PCR NEGATIVE NEGATIVE Final    Comment: (NOTE) The Xpert Xpress SARS-CoV-2/FLU/RSV plus assay is intended as an aid in the diagnosis of influenza from Nasopharyngeal swab specimens and should not be used as a sole basis for treatment. Nasal washings and aspirates are unacceptable for Xpert Xpress SARS-CoV-2/FLU/RSV testing.  Fact Sheet for Patients: EntrepreneurPulse.com.au  Fact Sheet for Healthcare Providers: IncredibleEmployment.be  This test is not yet approved or cleared by the [place] FDA and has been authorized for detection and/or diagnosis of SARS-CoV-2 by FDA under an Emergency Use Authorization (EUA). This EUA will remain in effect (meaning this test can be used) for the duration of the COVID-19 declaration under Section 564(b)(1) of the Act, 21 U.S.C. section 360bbb-3(b)(1), unless the authorization is terminated or revoked.  Performed at [facility], [employer] [address], [place], [place] [address]      Discharge Instructions:   Discharge Instructions     Call MD for:  persistant dizziness or light-headedness   Complete by: As directed    Call MD for:  persistant nausea and vomiting   Complete by: As directed    Call MD for:  temperature >  100.4   Complete by: As directed    Diet - low sodium heart healthy   Complete by: As directed    Discharge instructions   Complete by: As directed    Please follow-up with your primary care physician in 1 week.  Discussed about the pulmonary nodule follow-up.  Follow-up with your GI doctor in 1 to 2 weeks  for diarrhea.  Increase fluid hydration.  Seek medical attention if you experience worsening symptoms.  Take time while standing up from lying position.   Increase activity slowly   Complete by: As directed       Allergies as of 05/31/2021   No Known Allergies      Medication List     STOP taking these medications    oxyCODONE 5 MG immediate release tablet Commonly known as: Oxy IR/ROXICODONE       TAKE these medications    acetaminophen 500 MG tablet Commonly known as: TYLENOL Take 1,000 mg by mouth every 6 (six) hours as needed for moderate pain.   aspirin 81 MG tablet Take 81 mg by mouth daily.   co-enzyme Q-10 50 MG capsule Take 100 mg by mouth daily.   famotidine 20 MG tablet Commonly known as: PEPCID Take 20 mg by mouth 2 (two) times daily.   latanoprost 0.005 % ophthalmic solution Commonly known as: XALATAN Place 1 drop into both eyes at bedtime.   levothyroxine 100 MCG tablet Commonly known as: SYNTHROID Take 100 mcg by mouth daily before breakfast.   losartan 50 MG tablet Commonly known as: COZAAR Take 50 mg by mouth daily.   simvastatin 10 MG tablet Commonly known as: ZOCOR Take 10 mg by mouth daily.   sucralfate 1 g tablet Commonly known as: CARAFATE Take 1 g by mouth 2 (two) times daily.   Turmeric 500 MG Caps Take 500 mg by mouth daily.   verapamil 180 MG CR tablet Commonly known as: CALAN-SR Take 180 mg by mouth 2 (two) times daily.          Time coordinating discharge: 39 minutes  Signed:  Sirius Woodford  [facility] 05/31/2021, 2:58 PM

## 2021-05-31 NOTE — ED Notes (Signed)
Pt ambulated with steady gait to restroom ?

## 2021-05-31 NOTE — Care Management (Signed)
Attempted MOON for observation, patient unavailable

## 2021-05-31 NOTE — ED Notes (Signed)
Meal tray 

## 2021-05-31 NOTE — ED Notes (Signed)
Returned from [place] at this time

## 2021-05-31 NOTE — ED Notes (Signed)
Report given to [facility] at this time

## 2021-05-31 NOTE — ED Notes (Signed)
Transport here to take pt to [place] at this time

## 2021-05-31 NOTE — Progress Notes (Signed)
Physical Therapy Note  PT eval complete with full note to follow;  Recommend home for transition out of hospital;  Orthostatic BPs negative for drop including after hallway ambulation;  Pt independent with walking and mobility, and states he feels like he is back to baseline;  OK for dc home from PT standpoint, and no PT follow up needed;   Roney Marion, [place] Pager [phone] Office [phone]

## 2021-05-31 NOTE — ED Notes (Signed)
Provided pt with a beverage. Washcloth to wash face. Spouse brought in toothbrush so he can brush his teeth.

## 2021-05-31 NOTE — ED Notes (Signed)
Pt stated his back was hurting d/t the way he is sitting in bed. Repositioned the pt and provided PRN Tylenol

## 2021-05-31 NOTE — Progress Notes (Signed)
  Echocardiogram 2D Echocardiogram has been performed.  Mark Ewing 05/31/2021, 8:43 AM

## 2021-06-02 DIAGNOSIS — K625 Hemorrhage of anus and rectum: Secondary | ICD-10-CM | POA: Diagnosis not present

## 2021-06-02 DIAGNOSIS — R140 Abdominal distension (gaseous): Secondary | ICD-10-CM | POA: Diagnosis not present

## 2021-06-02 DIAGNOSIS — E871 Hypo-osmolality and hyponatremia: Secondary | ICD-10-CM | POA: Diagnosis not present

## 2021-06-02 DIAGNOSIS — K219 Gastro-esophageal reflux disease without esophagitis: Secondary | ICD-10-CM | POA: Diagnosis not present

## 2021-06-02 DIAGNOSIS — R911 Solitary pulmonary nodule: Secondary | ICD-10-CM | POA: Diagnosis not present

## 2021-06-02 DIAGNOSIS — R195 Other fecal abnormalities: Secondary | ICD-10-CM | POA: Diagnosis not present

## 2021-06-02 DIAGNOSIS — R55 Syncope and collapse: Secondary | ICD-10-CM | POA: Diagnosis not present

## 2021-06-02 DIAGNOSIS — K5909 Other constipation: Secondary | ICD-10-CM | POA: Diagnosis not present

## 2021-06-04 DIAGNOSIS — M1611 Unilateral primary osteoarthritis, right hip: Secondary | ICD-10-CM | POA: Diagnosis not present

## 2021-06-04 DIAGNOSIS — K219 Gastro-esophageal reflux disease without esophagitis: Secondary | ICD-10-CM | POA: Diagnosis not present

## 2021-06-04 DIAGNOSIS — E039 Hypothyroidism, unspecified: Secondary | ICD-10-CM | POA: Diagnosis not present

## 2021-06-04 DIAGNOSIS — E782 Mixed hyperlipidemia: Secondary | ICD-10-CM | POA: Diagnosis not present

## 2021-06-04 DIAGNOSIS — I10 Essential (primary) hypertension: Secondary | ICD-10-CM | POA: Diagnosis not present

## 2021-06-11 ENCOUNTER — Ambulatory Visit: Payer: Medicare HMO | Admitting: Emergency Medicine

## 2021-06-11 ENCOUNTER — Other Ambulatory Visit: Payer: Self-pay

## 2021-06-11 ENCOUNTER — Encounter: Payer: Self-pay | Admitting: Emergency Medicine

## 2021-06-11 DIAGNOSIS — R918 Other nonspecific abnormal finding of lung field: Secondary | ICD-10-CM | POA: Diagnosis not present

## 2021-06-11 NOTE — Assessment & Plan Note (Signed)
2 pulmonary nodules in the right lung, based on appearance these are at least moderate suspicion for primary lung cancer.  He is a never smoker with no family history so overall would be low risk.  He is undergoing a GI evaluation for hematochezia of unclear etiology, question possible GI malignancy.  His colonoscopy and endoscopy are scheduled for 9/14.  I do believe he will need bronchoscopy to sample the pulmonary nodules unless a likely cause is revealed by his GI eval.  I will get a PET scan now, follow-up next available after 9/14 to review this and the GI results.  Suspect we will be setting up bronchoscopy at that time.

## 2021-06-11 NOTE — Addendum Note (Signed)
Addended by: Gavin Potters R on: 06/11/2021 11:17 AM   Modules accepted: Orders

## 2021-06-11 NOTE — Progress Notes (Signed)
Subjective:    Patient ID: Mark Ewing, male    DOB: 02/16/1941, 80 y.o.   MRN: [id number]  HPI 80 year old gentleman, never smoker, with a history of spindle cell skin cancer (scalp, removed 2020), carotid disease, hypertension, hyperlipidemia, thyroid nodule.  He is referred today by Dr. Rockwell Germany for evaluation of an abnormal CT scan of the chest. He was in the emergency department 05/30/2021 with dizziness and a syncopal episode, assoc w GI bleeding and abdominal pain beginning the day prior.  A CT scan of his chest abdomen pelvis was performed as below as part of his evaluation. He reports no CP, SOB. He is able to be quite active, works in the garden. He does have some orthostasis, kneeling to standing. Minimal cough.     CT chest 05/30/2021 reviewed by me shows a lobulated 13 x 11 mm peripheral right upper lobe pulmonary nodule, spiculated anterior right upper lobe nodule 2.0 x 2.3 cm.    Review of Systems As per HPI  Past Medical History:  Diagnosis Date  . Cancer ([place])    per patient "malignant spindle cell neoplasm on top of scalp"  . Carotid artery stenosis    9/73/53 [place]: 29-92% LICA stenosis by velocities, CTA neck rec that showed no sign LICA stenosis  . GERD (gastroesophageal reflux disease)   . Hyperlipidemia   . Hypertension   . Hypothyroidism   . Inguinal hernia   . Mixed hyperlipidemia 05/30/2021  . Symptomatic cholelithiasis   . Thyroid disease   . Thyroid nodule      Family History  Problem Relation Age of Onset  . Hypertension Mother   . Ovarian cancer Mother   . Hypertension Father   . CVA Father   . Prostate cancer Father   . Hypertension Brother   . Hypertension Paternal Grandfather   . CVA Paternal Grandfather      Social History   Socioeconomic History  . Marital status: Married    Spouse name: Not on file  . Number of children: Not on file  . Years of education: Not on file  . Highest education level: Not on file  Occupational History   . Not on file  Tobacco Use  . Smoking status: Never  . Smokeless tobacco: Never  Vaping Use  . Vaping Use: Never used  Substance and Sexual Activity  . Alcohol use: No  . Drug use: No  . Sexual activity: Not on file  Other Topics Concern  . Not on file  Social History Narrative  . Not on file   Social Determinants of Health   Financial Resource Strain: Not on file  Food Insecurity: Not on file  Transportation Needs: Not on file  Physical Activity: Not on file  Stress: Not on file  Social Connections: Not on file  Intimate Partner Violence: Not on file     No Known Allergies   Outpatient Medications Prior to Visit  Medication Sig Dispense Refill  . acetaminophen (TYLENOL) 500 MG tablet Take 1,000 mg by mouth every 6 (six) hours as needed for moderate pain.    Marland Kitchen aspirin 81 MG tablet Take 81 mg by mouth daily.    Marland Kitchen co-enzyme Q-10 50 MG capsule Take 100 mg by mouth daily.    . famotidine (PEPCID) 20 MG tablet Take 20 mg by mouth 2 (two) times daily.    Marland Kitchen latanoprost (XALATAN) 0.005 % ophthalmic solution Place 1 drop into both eyes at bedtime.    Marland Kitchen levothyroxine (SYNTHROID)  100 MCG tablet Take 100 mcg by mouth daily before breakfast.    . losartan (COZAAR) 50 MG tablet Take 50 mg by mouth daily.     . simvastatin (ZOCOR) 10 MG tablet Take 10 mg by mouth daily.    . verapamil (CALAN-SR) 180 MG CR tablet Take 180 mg by mouth 2 (two) times daily.     . sucralfate (CARAFATE) 1 g tablet Take 1 g by mouth 2 (two) times daily. (Patient not taking: Reported on 06/11/2021)    . Turmeric 500 MG CAPS Take 500 mg by mouth daily. (Patient not taking: Reported on 06/11/2021)     No facility-administered medications prior to visit.         Objective:   Physical Exam Vitals:   06/11/21 0929  BP: 130/72  Pulse: 74  Temp: 98.5 F (36.9 C)  TempSrc: Oral  SpO2: 97%  Weight: 164 lb 6.4 oz (74.6 kg)  Height: 5\' 9"  (1.753 m)   Gen: Pleasant, well-nourished elderly man, in no  distress,  normal affect  ENT: No lesions,  mouth clear,  oropharynx clear, no postnasal drip  Neck: No JVD, no stridor  Lungs: No use of accessory muscles, no crackles or wheezing on normal respiration, no wheeze on forced expiration  Cardiovascular: RRR, heart sounds normal, no murmur or gallops, no peripheral edema  Musculoskeletal: No deformities, no cyanosis or clubbing  Neuro: alert, awake, non focal  Skin: Warm, no lesions or rash      Assessment & Plan:   Pulmonary nodules/lesions, multiple 2 pulmonary nodules in the right lung, based on appearance these are at least moderate suspicion for primary lung cancer.  He is a never smoker with no family history so overall would be low risk.  He is undergoing a GI evaluation for hematochezia of unclear etiology, question possible GI malignancy.  His colonoscopy and endoscopy are scheduled for 9/14.  I do believe he will need bronchoscopy to sample the pulmonary nodules unless a likely cause is revealed by his GI eval.  I will get a PET scan now, follow-up next available after 9/14 to review this and the GI results.  Suspect we will be setting up bronchoscopy at that time.  Baltazar Apo, MD, PhD 06/11/2021, 10:08 AM [facility] [phone] or if no answer before 7:00PM call [phone] For any issues after 7:00PM please call eLink [phone]

## 2021-06-11 NOTE — Patient Instructions (Signed)
We will arrange for PET scan to be done before our next visit Agree with your GI evaluation and plans for endoscopy, colonoscopy on 9/14. Follow-up next available after your GI procedures so that we can review your PET scan and decide whether to pursue bronchoscopy to biopsy your small pulmonary nodules.

## 2021-06-25 ENCOUNTER — Encounter (HOSPITAL_COMMUNITY)
Admission: RE | Admit: 2021-06-25 | Discharge: 2021-06-25 | Disposition: A | Discharge status: Home / Self Care | Payer: Medicare HMO | Source: Ambulatory Visit | Attending: Emergency Medicine | Admitting: Emergency Medicine

## 2021-06-25 ENCOUNTER — Other Ambulatory Visit: Payer: Self-pay

## 2021-06-25 DIAGNOSIS — K6389 Other specified diseases of intestine: Secondary | ICD-10-CM | POA: Diagnosis not present

## 2021-06-25 DIAGNOSIS — R918 Other nonspecific abnormal finding of lung field: Secondary | ICD-10-CM | POA: Diagnosis not present

## 2021-06-25 DIAGNOSIS — I2510 Atherosclerotic heart disease of native coronary artery without angina pectoris: Secondary | ICD-10-CM | POA: Insufficient documentation

## 2021-06-25 DIAGNOSIS — D179 Benign lipomatous neoplasm, unspecified: Secondary | ICD-10-CM | POA: Diagnosis not present

## 2021-06-25 DIAGNOSIS — I700 Atherosclerosis of aorta: Secondary | ICD-10-CM | POA: Diagnosis not present

## 2021-06-25 DIAGNOSIS — D3500 Benign neoplasm of unspecified adrenal gland: Secondary | ICD-10-CM | POA: Diagnosis not present

## 2021-06-25 DIAGNOSIS — D3502 Benign neoplasm of left adrenal gland: Secondary | ICD-10-CM | POA: Insufficient documentation

## 2021-06-25 LAB — GLUCOSE, CAPILLARY: Glucose-Capillary: 97 mg/dL (ref 70–99)

## 2021-06-25 MED ORDER — FLUDEOXYGLUCOSE F - 18 (FDG) INJECTION
8.2000 | Freq: Once | INTRAVENOUS | Status: AC
  Administered 2021-06-25: 8.1 via INTRAVENOUS

## 2021-07-01 DIAGNOSIS — K625 Hemorrhage of anus and rectum: Secondary | ICD-10-CM | POA: Diagnosis not present

## 2021-07-01 DIAGNOSIS — R12 Heartburn: Secondary | ICD-10-CM | POA: Diagnosis not present

## 2021-07-01 DIAGNOSIS — K2930 Chronic superficial gastritis without bleeding: Secondary | ICD-10-CM | POA: Diagnosis not present

## 2021-07-01 DIAGNOSIS — R140 Abdominal distension (gaseous): Secondary | ICD-10-CM | POA: Diagnosis not present

## 2021-07-01 DIAGNOSIS — K648 Other hemorrhoids: Secondary | ICD-10-CM | POA: Diagnosis not present

## 2021-07-02 ENCOUNTER — Ambulatory Visit: Payer: Medicare HMO | Admitting: Emergency Medicine

## 2021-07-02 ENCOUNTER — Telehealth: Payer: Self-pay | Admitting: Emergency Medicine

## 2021-07-02 ENCOUNTER — Other Ambulatory Visit: Payer: Self-pay

## 2021-07-02 ENCOUNTER — Encounter: Payer: Self-pay | Admitting: Emergency Medicine

## 2021-07-02 VITALS — BP 140/70 | HR 68 | Temp 98.0°F | Ht 69.0 in | Wt 164.6 lb

## 2021-07-02 DIAGNOSIS — R918 Other nonspecific abnormal finding of lung field: Secondary | ICD-10-CM | POA: Diagnosis not present

## 2021-07-02 DIAGNOSIS — Z23 Encounter for immunization: Secondary | ICD-10-CM

## 2021-07-02 NOTE — H&P (View-Only) (Signed)
Subjective:    Patient ID: Mark Ewing, male    DOB: [birth year], 80 y.o.   MRN: [id number]  HPI 80 year old gentleman, never smoker, with a history of spindle cell skin cancer (scalp, removed 2020), carotid disease, hypertension, hyperlipidemia, thyroid nodule.  He is referred today by Dr. Rockwell Germany for evaluation of an abnormal CT scan of the chest. He was in the emergency department 05/30/2021 with dizziness and a syncopal episode, assoc w GI bleeding and abdominal pain beginning the day prior.  A CT scan of his chest abdomen pelvis was performed as below as part of his evaluation. He reports no CP, SOB. He is able to be quite active, works in the garden. He does have some orthostasis, kneeling to standing. Minimal cough.    CT chest 05/30/2021 reviewed by me shows a lobulated 13 x 11 mm peripheral right upper lobe pulmonary nodule, spiculated anterior right upper lobe nodule 2.0 x 2.3 cm.    ROV 07/02/21 --Mark Ewing 80 with a history of hypertension, carotid disease, spindle cell skin cancer (surgically treated).  He is here today to follow pulmonary nodular disease that was discovered by CT chest 05/30/2021. He feels well. Had a reassuring CSY and EGD on 9/14 >> gastritis and internal hemorrhoid. No L rib tenderness or correlate with area of hypermetabolism on his PET scan  PET scan 06/25/2021 reviewed by me shows there is hypermetabolism and is 2.8 x 3.0 cm anterior right upper lobe nodule, hypermetabolism in the 1.4 x 1.3 cm right upper lobe apical nodule.  There is some mild accentuated activity in the left abdominal loop of small bowel that was felt to be most likely physiologic. ? Also a L 7th rib hypermetabolism with no clear lytic correlate.    Review of Systems As per HPI  Past Medical History:  Diagnosis Date   Cancer ([place])    per patient "malignant spindle cell neoplasm on top of scalp"   Carotid artery stenosis    8/33/82 [place]: 50-53% LICA stenosis by velocities, CTA neck rec  that showed no sign LICA stenosis   GERD (gastroesophageal reflux disease)    Hyperlipidemia    Hypertension    Hypothyroidism    Inguinal hernia    Mixed hyperlipidemia 05/30/2021   Symptomatic cholelithiasis    Thyroid disease    Thyroid nodule      Family History  Problem Relation Age of Onset   Hypertension Mother    Ovarian cancer Mother    Hypertension Father    CVA Father    Prostate cancer Father    Hypertension Brother    Hypertension Paternal Grandfather    CVA Paternal Grandfather      Social History   Socioeconomic History   Marital status: Married    Spouse name: Not on file   Number of children: Not on file   Years of education: Not on file   Highest education level: Not on file  Occupational History   Not on file  Tobacco Use   Smoking status: Never   Smokeless tobacco: Never  Vaping Use   Vaping Use: Never used  Substance and Sexual Activity   Alcohol use: No   Drug use: No   Sexual activity: Not on file  Other Topics Concern   Not on file  Social History Narrative   Not on file   Social Determinants of Health   Financial Resource Strain: Not on file  Food Insecurity: Not on file  Transportation Needs: Not on  file  Physical Activity: Not on file  Stress: Not on file  Social Connections: Not on file  Intimate Partner Violence: Not on file     No Known Allergies   Outpatient Medications Prior to Visit  Medication Sig Dispense Refill   acetaminophen (TYLENOL) 500 MG tablet Take 1,000 mg by mouth every 6 (six) hours as needed for moderate pain.     aspirin 81 MG tablet Take 81 mg by mouth daily.     co-enzyme Q-10 50 MG capsule Take 100 mg by mouth daily.     famotidine (PEPCID) 20 MG tablet Take 20 mg by mouth 2 (two) times daily.     latanoprost (XALATAN) 0.005 % ophthalmic solution Place 1 drop into both eyes at bedtime.     levothyroxine (SYNTHROID) 100 MCG tablet Take 100 mcg by mouth daily before breakfast.     losartan (COZAAR) 50  MG tablet Take 50 mg by mouth daily.      simvastatin (ZOCOR) 10 MG tablet Take 10 mg by mouth daily.     sucralfate (CARAFATE) 1 g tablet Take 1 g by mouth 2 (two) times daily.     Turmeric 500 MG CAPS Take 500 mg by mouth daily.     verapamil (CALAN-SR) 180 MG CR tablet Take 180 mg by mouth 2 (two) times daily.      No facility-administered medications prior to visit.         Objective:   Physical Exam Vitals:   07/02/21 1117  BP: 140/70  Pulse: 68  Temp: 98 F (36.7 C)  TempSrc: Oral  SpO2: 97%  Weight: 164 lb 9.6 oz (74.7 kg)  Height: 5\' 9"  (1.753 m)   Gen: Pleasant, well-nourished elderly man, in no distress,  normal affect  ENT: No lesions,  mouth clear,  oropharynx clear, no postnasal drip  Neck: No JVD, no stridor  Lungs: No use of accessory muscles, no crackles or wheezing on normal respiration, no wheeze on forced expiration  Cardiovascular: RRR, heart sounds normal, no murmur or gallops, no peripheral edema  Musculoskeletal: No deformities, no cyanosis or clubbing  Neuro: alert, awake, non focal  Skin: Warm, no lesions or rash      Assessment & Plan:   Pulmonary nodules/lesions, multiple He is to right upper lobe pulmonary nodules are both hypermetabolic.  I do not see any mediastinal adenopathy or mediastinal hypermetabolism.  We discussed the potential for referral to thoracic surgery for primary resection of the right upper lobe that would get both nodules.  That said I think the more reasonable approach is to try to get a tissue diagnosis by bronchoscopy especially since there is an area of left rib hypermetabolism unclear significance, small bowel hypermetabolism of unclear significance.  He may ultimately still be a surgical candidate after the work-up is completed.  I will obtain pulmonary function testing.  Work on setting up robotic assisted navigational bronchoscopy as soon as feasible  We will work on setting up navigational bronchoscopy to  evaluate your right upper lobe pulmonary nodules.  This will be done at [facility] endoscopy as an outpatient under general anesthesia.  You will need a designated driver and someone to be with you on the evening following the procedure.  You will need to stop your aspirin 2 days prior.  We will work on getting this set up for 07/13/2021. We will arrange for pulmonary function testing Follow with Dr Lamonte Sakai in 1 month  Baltazar Apo, MD, PhD 07/02/2021, 1:26  PM [facility] [phone] or if no answer before 7:00PM call [phone] For any issues after 7:00PM please call eLink [phone]

## 2021-07-02 NOTE — Patient Instructions (Signed)
We will work on setting up navigational bronchoscopy to evaluate your right upper lobe pulmonary nodules.  This will be done at [facility] endoscopy as an outpatient under general anesthesia.  You will need a designated driver and someone to be with you on the evening following the procedure.  You will need to stop your aspirin 2 days prior.  We will work on getting this set up for 07/13/2021. We will arrange for pulmonary function testing Follow with Dr Lamonte Sakai in 1 month

## 2021-07-02 NOTE — Assessment & Plan Note (Signed)
He is to right upper lobe pulmonary nodules are both hypermetabolic.  I do not see any mediastinal adenopathy or mediastinal hypermetabolism.  We discussed the potential for referral to thoracic surgery for primary resection of the right upper lobe that would get both nodules.  That said I think the more reasonable approach is to try to get a tissue diagnosis by bronchoscopy especially since there is an area of left rib hypermetabolism unclear significance, small bowel hypermetabolism of unclear significance.  He may ultimately still be a surgical candidate after the work-up is completed.  I will obtain pulmonary function testing.  Work on setting up robotic assisted navigational bronchoscopy as soon as feasible  We will work on setting up navigational bronchoscopy to evaluate your right upper lobe pulmonary nodules.  This will be done at [facility] endoscopy as an outpatient under general anesthesia.  You will need a designated driver and someone to be with you on the evening following the procedure.  You will need to stop your aspirin 2 days prior.  We will work on getting this set up for 07/13/2021. We will arrange for pulmonary function testing Follow with Dr Lamonte Sakai in 1 month

## 2021-07-02 NOTE — Telephone Encounter (Signed)
Thew PFT don't need to be done before the bronchoscopy. I just want them done as soon as feasible

## 2021-07-02 NOTE — Telephone Encounter (Signed)
Instructions  We will work on setting up navigational bronchoscopy to evaluate your right upper lobe pulmonary nodules.  This will be done at [facility] endoscopy as an outpatient under general anesthesia.  You will need a designated driver and someone to be with you on the evening following the procedure.  You will need to stop your aspirin 2 days prior.  We will work on getting this set up for 07/13/2021. We will arrange for pulmonary function testing Follow with Dr Lamonte Sakai in 1 month        It does not say if PFT is to be done same day as follow up or if it is to be done same day as OV. Dr. Lamonte Sakai please advise on this so we can make sure we get the PFT scheduled at right time for pt.

## 2021-07-02 NOTE — Telephone Encounter (Signed)
Called and spoke with patient to let him know that he does not have to have the PFT done before bronch per Dr. Lamonte Sakai and that he just wants it done as soon as feasible. I have scheduled patient for PFT the same day as his follow up appointment 08/13/21. Nothing further needed at this time.

## 2021-07-02 NOTE — Telephone Encounter (Signed)
I scheduled patient for 9/26 at 10:00 at [facility].  He will go for covid test on 9/23.  I called [facility] CT & Brittney will have disc made.  Gave appt info to pt.

## 2021-07-02 NOTE — Progress Notes (Signed)
Subjective:    Patient ID: Mark Ewing, male    DOB: Mar 03, 1941, 80 y.o.   MRN: [id number]  HPI 80 year old gentleman, never smoker, with a history of spindle cell skin cancer (scalp, removed 2020), carotid disease, hypertension, hyperlipidemia, thyroid nodule.  He is referred today by Dr. Rockwell Ewing for evaluation of an abnormal CT scan of the chest. He was in the emergency department 05/30/2021 with dizziness and a syncopal episode, assoc w GI bleeding and abdominal pain beginning the day prior.  A CT scan of his chest abdomen pelvis was performed as below as part of his evaluation. He reports no CP, SOB. He is able to be quite active, works in the garden. He does have some orthostasis, kneeling to standing. Minimal cough.    CT chest 05/30/2021 reviewed by me shows a lobulated 13 x 11 mm peripheral right upper lobe pulmonary nodule, spiculated anterior right upper lobe nodule 2.0 x 2.3 cm.    ROV 07/02/21 --Mark Ewing 80 with a history of hypertension, carotid disease, spindle cell skin cancer (surgically treated).  He is here today to follow pulmonary nodular disease that was discovered by CT chest 05/30/2021. He feels well. Had a reassuring CSY and EGD on 9/14 >> gastritis and internal hemorrhoid. No L rib tenderness or correlate with area of hypermetabolism on his PET scan  PET scan 06/25/2021 reviewed by me shows there is hypermetabolism and is 2.8 x 3.0 cm anterior right upper lobe nodule, hypermetabolism in the 1.4 x 1.3 cm right upper lobe apical nodule.  There is some mild accentuated activity in the left abdominal loop of small bowel that was felt to be most likely physiologic. ? Also a L 7th rib hypermetabolism with no clear lytic correlate.    Review of Systems As per HPI  Past Medical History:  Diagnosis Date   Cancer ([place])    per patient "malignant spindle cell neoplasm on top of scalp"   Carotid artery stenosis    1/66/06 [place]: 30-16% LICA stenosis by velocities, CTA neck rec  that showed no sign LICA stenosis   GERD (gastroesophageal reflux disease)    Hyperlipidemia    Hypertension    Hypothyroidism    Inguinal hernia    Mixed hyperlipidemia 05/30/2021   Symptomatic cholelithiasis    Thyroid disease    Thyroid nodule      Family History  Problem Relation Age of Onset   Hypertension Mother    Ovarian cancer Mother    Hypertension Father    CVA Father    Prostate cancer Father    Hypertension Brother    Hypertension Paternal Grandfather    CVA Paternal Grandfather      Social History   Socioeconomic History   Marital status: Married    Spouse name: Not on file   Number of children: Not on file   Years of education: Not on file   Highest education level: Not on file  Occupational History   Not on file  Tobacco Use   Smoking status: Never   Smokeless tobacco: Never  Vaping Use   Vaping Use: Never used  Substance and Sexual Activity   Alcohol use: No   Drug use: No   Sexual activity: Not on file  Other Topics Concern   Not on file  Social History Narrative   Not on file   Social Determinants of Health   Financial Resource Strain: Not on file  Food Insecurity: Not on file  Transportation Needs: Not on  file  Physical Activity: Not on file  Stress: Not on file  Social Connections: Not on file  Intimate Partner Violence: Not on file     No Known Allergies   Outpatient Medications Prior to Visit  Medication Sig Dispense Refill   acetaminophen (TYLENOL) 500 MG tablet Take 1,000 mg by mouth every 6 (six) hours as needed for moderate pain.     aspirin 81 MG tablet Take 81 mg by mouth daily.     co-enzyme Q-10 50 MG capsule Take 100 mg by mouth daily.     famotidine (PEPCID) 20 MG tablet Take 20 mg by mouth 2 (two) times daily.     latanoprost (XALATAN) 0.005 % ophthalmic solution Place 1 drop into both eyes at bedtime.     levothyroxine (SYNTHROID) 100 MCG tablet Take 100 mcg by mouth daily before breakfast.     losartan (COZAAR) 50  MG tablet Take 50 mg by mouth daily.      simvastatin (ZOCOR) 10 MG tablet Take 10 mg by mouth daily.     sucralfate (CARAFATE) 1 g tablet Take 1 g by mouth 2 (two) times daily.     Turmeric 500 MG CAPS Take 500 mg by mouth daily.     verapamil (CALAN-SR) 180 MG CR tablet Take 180 mg by mouth 2 (two) times daily.      No facility-administered medications prior to visit.         Objective:   Physical Exam Vitals:   07/02/21 1117  BP: 140/70  Pulse: 68  Temp: 98 F (36.7 C)  TempSrc: Oral  SpO2: 97%  Weight: 164 lb 9.6 oz (74.7 kg)  Height: 5\' 9"  (1.753 m)   Gen: Pleasant, well-nourished elderly man, in no distress,  normal affect  ENT: No lesions,  mouth clear,  oropharynx clear, no postnasal drip  Neck: No JVD, no stridor  Lungs: No use of accessory muscles, no crackles or wheezing on normal respiration, no wheeze on forced expiration  Cardiovascular: RRR, heart sounds normal, no murmur or gallops, no peripheral edema  Musculoskeletal: No deformities, no cyanosis or clubbing  Neuro: alert, awake, non focal  Skin: Warm, no lesions or rash      Assessment & Plan:   Pulmonary nodules/lesions, multiple He is to right upper lobe pulmonary nodules are both hypermetabolic.  I do not see any mediastinal adenopathy or mediastinal hypermetabolism.  We discussed the potential for referral to thoracic surgery for primary resection of the right upper lobe that would get both nodules.  That said I think the more reasonable approach is to try to get a tissue diagnosis by bronchoscopy especially since there is an area of left rib hypermetabolism unclear significance, small bowel hypermetabolism of unclear significance.  He may ultimately still be a surgical candidate after the work-up is completed.  I will obtain pulmonary function testing.  Work on setting up robotic assisted navigational bronchoscopy as soon as feasible  We will work on setting up navigational bronchoscopy to  evaluate your right upper lobe pulmonary nodules.  This will be done at [facility] endoscopy as an outpatient under general anesthesia.  You will need a designated driver and someone to be with you on the evening following the procedure.  You will need to stop your aspirin 2 days prior.  We will work on getting this set up for 07/13/2021. We will arrange for pulmonary function testing Follow with Dr Lamonte Sakai in 1 month  Baltazar Apo, MD, PhD 07/02/2021, 1:26  PM [facility] [phone] or if no answer before 7:00PM call [phone] For any issues after 7:00PM please call eLink [phone]

## 2021-07-06 DIAGNOSIS — R918 Other nonspecific abnormal finding of lung field: Secondary | ICD-10-CM | POA: Diagnosis not present

## 2021-07-06 DIAGNOSIS — R1310 Dysphagia, unspecified: Secondary | ICD-10-CM | POA: Diagnosis not present

## 2021-07-06 DIAGNOSIS — K5900 Constipation, unspecified: Secondary | ICD-10-CM | POA: Diagnosis not present

## 2021-07-06 DIAGNOSIS — I10 Essential (primary) hypertension: Secondary | ICD-10-CM | POA: Diagnosis not present

## 2021-07-06 DIAGNOSIS — Z1389 Encounter for screening for other disorder: Secondary | ICD-10-CM | POA: Diagnosis not present

## 2021-07-06 DIAGNOSIS — E039 Hypothyroidism, unspecified: Secondary | ICD-10-CM | POA: Diagnosis not present

## 2021-07-06 DIAGNOSIS — Z0000 Encounter for general adult medical examination without abnormal findings: Secondary | ICD-10-CM | POA: Diagnosis not present

## 2021-07-06 DIAGNOSIS — E782 Mixed hyperlipidemia: Secondary | ICD-10-CM | POA: Diagnosis not present

## 2021-07-06 DIAGNOSIS — E042 Nontoxic multinodular goiter: Secondary | ICD-10-CM | POA: Diagnosis not present

## 2021-07-08 DIAGNOSIS — K2930 Chronic superficial gastritis without bleeding: Secondary | ICD-10-CM | POA: Diagnosis not present

## 2021-07-09 ENCOUNTER — Other Ambulatory Visit: Payer: Self-pay

## 2021-07-09 ENCOUNTER — Encounter (HOSPITAL_COMMUNITY): Payer: Self-pay | Admitting: Emergency Medicine

## 2021-07-09 NOTE — Progress Notes (Signed)
Spoke with pt, pre-op instructions given.

## 2021-07-10 ENCOUNTER — Other Ambulatory Visit: Payer: Self-pay | Admitting: Emergency Medicine

## 2021-07-11 LAB — SARS CORONAVIRUS 2 (TAT 6-24 HRS): SARS Coronavirus 2: NEGATIVE

## 2021-07-13 ENCOUNTER — Ambulatory Visit (HOSPITAL_COMMUNITY): Payer: Medicare HMO | Admitting: Anesthesiology

## 2021-07-13 ENCOUNTER — Ambulatory Visit (HOSPITAL_COMMUNITY)
Admission: RE | Admit: 2021-07-13 | Discharge: 2021-07-13 | Disposition: A | Discharge status: Home / Self Care | Payer: Medicare HMO | Attending: Emergency Medicine | Admitting: Emergency Medicine

## 2021-07-13 ENCOUNTER — Ambulatory Visit (HOSPITAL_COMMUNITY): Payer: Medicare HMO

## 2021-07-13 ENCOUNTER — Encounter (HOSPITAL_COMMUNITY): Payer: Self-pay | Admitting: Emergency Medicine

## 2021-07-13 ENCOUNTER — Encounter (HOSPITAL_COMMUNITY)
Admission: RE | Disposition: A | Discharge status: Home / Self Care | Payer: Self-pay | Source: Home / Self Care | Attending: Emergency Medicine

## 2021-07-13 DIAGNOSIS — K219 Gastro-esophageal reflux disease without esophagitis: Secondary | ICD-10-CM | POA: Diagnosis not present

## 2021-07-13 DIAGNOSIS — I10 Essential (primary) hypertension: Secondary | ICD-10-CM | POA: Insufficient documentation

## 2021-07-13 DIAGNOSIS — Z7982 Long term (current) use of aspirin: Secondary | ICD-10-CM | POA: Insufficient documentation

## 2021-07-13 DIAGNOSIS — E039 Hypothyroidism, unspecified: Secondary | ICD-10-CM | POA: Diagnosis not present

## 2021-07-13 DIAGNOSIS — R918 Other nonspecific abnormal finding of lung field: Secondary | ICD-10-CM | POA: Diagnosis not present

## 2021-07-13 DIAGNOSIS — E782 Mixed hyperlipidemia: Secondary | ICD-10-CM | POA: Insufficient documentation

## 2021-07-13 DIAGNOSIS — I700 Atherosclerosis of aorta: Secondary | ICD-10-CM | POA: Insufficient documentation

## 2021-07-13 DIAGNOSIS — Z79899 Other long term (current) drug therapy: Secondary | ICD-10-CM | POA: Diagnosis not present

## 2021-07-13 DIAGNOSIS — Z419 Encounter for procedure for purposes other than remedying health state, unspecified: Secondary | ICD-10-CM

## 2021-07-13 DIAGNOSIS — Z9889 Other specified postprocedural states: Secondary | ICD-10-CM

## 2021-07-13 DIAGNOSIS — C3411 Malignant neoplasm of upper lobe, right bronchus or lung: Secondary | ICD-10-CM | POA: Diagnosis not present

## 2021-07-13 HISTORY — PX: VIDEO BRONCHOSCOPY WITH RADIAL ENDOBRONCHIAL ULTRASOUND: SHX6849

## 2021-07-13 HISTORY — PX: VIDEO BRONCHOSCOPY WITH ENDOBRONCHIAL NAVIGATION: SHX6175

## 2021-07-13 HISTORY — PX: BRONCHIAL NEEDLE ASPIRATION BIOPSY: SHX5106

## 2021-07-13 HISTORY — PX: BRONCHIAL BIOPSY: SHX5109

## 2021-07-13 HISTORY — PX: BRONCHIAL BRUSHINGS: SHX5108

## 2021-07-13 LAB — CBC
HCT: 37.9 % — ABNORMAL LOW (ref 39.0–52.0)
Hemoglobin: 12.8 g/dL — ABNORMAL LOW (ref 13.0–17.0)
MCH: 32.1 pg (ref 26.0–34.0)
MCHC: 33.8 g/dL (ref 30.0–36.0)
MCV: 95 fL (ref 80.0–100.0)
Platelets: 310 10*3/uL (ref 150–400)
RBC: 3.99 MIL/uL — ABNORMAL LOW (ref 4.22–5.81)
RDW: 12.3 % (ref 11.5–15.5)
WBC: 6.9 10*3/uL (ref 4.0–10.5)
nRBC: 0 % (ref 0.0–0.2)

## 2021-07-13 LAB — APTT: aPTT: 28 seconds (ref 24–36)

## 2021-07-13 LAB — BASIC METABOLIC PANEL
Anion gap: 10 (ref 5–15)
BUN: 13 mg/dL (ref 8–23)
CO2: 23 mmol/L (ref 22–32)
Calcium: 8.7 mg/dL — ABNORMAL LOW (ref 8.9–10.3)
Chloride: 96 mmol/L — ABNORMAL LOW (ref 98–111)
Creatinine, Ser: 0.87 mg/dL (ref 0.61–1.24)
GFR, Estimated: >60 mL/min (ref >60)
Glucose, Bld: 86 mg/dL (ref 70–99)
Potassium: 3.5 mmol/L (ref 3.5–5.1)
Sodium: 129 mmol/L — ABNORMAL LOW (ref 135–145)

## 2021-07-13 LAB — PROTIME-INR
INR: 1 (ref 0.8–1.2)
Prothrombin Time: 13.1 seconds (ref 11.4–15.2)

## 2021-07-13 SURGERY — VIDEO BRONCHOSCOPY WITH ENDOBRONCHIAL NAVIGATION
Anesthesia: General | Laterality: Right

## 2021-07-13 MED ORDER — SUCCINYLCHOLINE CHLORIDE 200 MG/10ML IV SOSY
PREFILLED_SYRINGE | INTRAVENOUS | Status: DC | PRN
  Administered 2021-07-13: 120 mg via INTRAVENOUS

## 2021-07-13 MED ORDER — CHLORHEXIDINE GLUCONATE 0.12 % MT SOLN: 15.0000 mL | Freq: Once | OROMUCOSAL | Status: AC

## 2021-07-13 MED ORDER — DEXAMETHASONE SODIUM PHOSPHATE 10 MG/ML IJ SOLN
INTRAMUSCULAR | Status: DC | PRN
  Administered 2021-07-13: 5 mg via INTRAVENOUS

## 2021-07-13 MED ORDER — ONDANSETRON HCL 4 MG/2ML IJ SOLN
4.0000 mg | Freq: Once | INTRAMUSCULAR | Status: DC | PRN
Start: 2021-07-13 — End: 2021-07-13

## 2021-07-13 MED ORDER — OXYCODONE HCL 5 MG/5ML PO SOLN: 5.0000 mg | Freq: Once | ORAL | Status: DC | PRN

## 2021-07-13 MED ORDER — LACTATED RINGERS IV SOLN: INTRAVENOUS | Status: DC

## 2021-07-13 MED ORDER — FENTANYL CITRATE (PF) 100 MCG/2ML IJ SOLN
INTRAMUSCULAR | Status: DC | PRN
  Administered 2021-07-13: 50 ug via INTRAVENOUS

## 2021-07-13 MED ORDER — SUGAMMADEX SODIUM 200 MG/2ML IV SOLN
INTRAVENOUS | Status: DC | PRN
  Administered 2021-07-13: 200 mg via INTRAVENOUS

## 2021-07-13 MED ORDER — LIDOCAINE 2% (20 MG/ML) 5 ML SYRINGE
INTRAMUSCULAR | Status: DC | PRN
  Administered 2021-07-13: 60 mg via INTRAVENOUS

## 2021-07-13 MED ORDER — OXYCODONE HCL 5 MG PO TABS: 5.0000 mg | ORAL_TABLET | Freq: Once | ORAL | Status: DC | PRN

## 2021-07-13 MED ORDER — ROCURONIUM BROMIDE 10 MG/ML (PF) SYRINGE
PREFILLED_SYRINGE | INTRAVENOUS | Status: DC | PRN
  Administered 2021-07-13: 10 mg via INTRAVENOUS
  Administered 2021-07-13: 50 mg via INTRAVENOUS

## 2021-07-13 MED ORDER — FENTANYL CITRATE (PF) 100 MCG/2ML IJ SOLN: 25.0000 ug | INTRAMUSCULAR | Status: DC | PRN

## 2021-07-13 MED ORDER — PHENYLEPHRINE 40 MCG/ML (10ML) SYRINGE FOR IV PUSH (FOR BLOOD PRESSURE SUPPORT)
PREFILLED_SYRINGE | INTRAVENOUS | Status: DC | PRN
  Administered 2021-07-13 (×2): 80 ug via INTRAVENOUS
  Administered 2021-07-13: 40 ug via INTRAVENOUS
  Administered 2021-07-13 (×2): 80 ug via INTRAVENOUS

## 2021-07-13 MED ORDER — CHLORHEXIDINE GLUCONATE 0.12 % MT SOLN
OROMUCOSAL | Status: AC
  Administered 2021-07-13: 15 mL
  Filled 2021-07-13: qty 15

## 2021-07-13 MED ORDER — PROPOFOL 10 MG/ML IV BOLUS
INTRAVENOUS | Status: DC | PRN
  Administered 2021-07-13: 120 mg via INTRAVENOUS

## 2021-07-13 MED ORDER — PHENYLEPHRINE HCL-NACL 20-0.9 MG/250ML-% IV SOLN
INTRAVENOUS | Status: DC | PRN
  Administered 2021-07-13: 30 ug/min via INTRAVENOUS

## 2021-07-13 NOTE — Op Note (Signed)
Video Bronchoscopy with Robotic Assisted Bronchoscopic Navigation   Date of Operation: 07/13/2021   Pre-op Diagnosis: Right upper lobe pulmonary nodules  Post-op Diagnosis: Same  Surgeon: Baltazar Apo  Assistants: None  Anesthesia: General endotracheal anesthesia  Operation: Flexible video fiberoptic bronchoscopy with robotic assistance and biopsies.  Estimated Blood Loss: Minimal  Complications: None  Indications and History: Mark Ewing is a 80 y.o. male never smoker with history of spindle cell skin cancer (resected), thyroid nodule.  He was found to have 2 hypermetabolic right upper lobe pulmonary nodules on CT scan and then PET scan.  Recommendation was made to achieve tissue diagnosis via robotic assisted navigational bronchoscopy. The risks, benefits, complications, treatment options and expected outcomes were discussed with the patient.  The possibilities of pneumothorax, pneumonia, reaction to medication, pulmonary aspiration, perforation of a viscus, bleeding, failure to diagnose a condition and creating a complication requiring transfusion or operation were discussed with the patient who freely signed the consent.    Description of Procedure: The patient was seen in the Preoperative Area, was examined and was deemed appropriate to proceed.  The patient was taken to [facility] endoscopy room 3, identified as Mark Ewing and the procedure verified as Flexible Video Fiberoptic Bronchoscopy.  A Time Out was held and the above information confirmed.   Prior to the date of the procedure a high-resolution CT scan of the chest was performed. Utilizing ION software program a virtual tracheobronchial tree was generated to allow the creation of distinct navigation pathways to the patient's parenchymal abnormalities. After being taken to the operating room general anesthesia was initiated and the patient  was orally intubated. The video fiberoptic bronchoscope was introduced via the  endotracheal tube and a general inspection was performed which showed normal left lung anatomy, an area of irregular lateral wall mucosa in the mid bronchus intermedius.  Awaiting needle endobronchial biopsy and several forceps endobronchial biopsies were performed on the raised area of bronchus intermedius mucosa.  Aspiration of the bilateral mainstems was completed to remove any remaining secretions. Robotic catheter inserted into patient's endotracheal tube.   Target #1 larger, more medial right upper lobe pulmonary nodule: The distinct navigation pathways prepared prior to this procedure were then utilized to navigate to patient's lesion identified on CT scan. The robotic catheter was secured into place and the vision probe was withdrawn.  Lesion location was approximated using fluoroscopy and radial endobronchial ultrasound for peripheral targeting. Under fluoroscopic guidance transbronchial brushings, transbronchial needle biopsies, and transbronchial forceps biopsies were performed to be sent for cytology and pathology.   Target #2 smaller, more lateral right upper lobe pulmonary nodule: The distinct navigation pathways prepared prior to this procedure were then utilized to navigate to patient's lesion identified on CT scan. The robotic catheter was secured into place and the vision probe was withdrawn.  I was unable to localize the nodule by either fluoroscopy or radial probe ultrasound.  For this reason no biopsy samples were performed on target 2.  At the end of the procedure a general airway inspection was performed and there was no evidence of active bleeding. The bronchoscope was removed.  The patient tolerated the procedure well. There was no significant blood loss and there were no obvious complications. A post-procedural chest x-ray is pending.  Samples Target #1: 1. Transbronchial brushings from right upper lobe pulmonary nodule 2. Transbronchial Wang needle biopsies from right upper  lobe pulmonary nodule 3. Transbronchial forceps biopsies from right upper lobe pulmonary nodule  Samples bronchus  intermedius: 1. Endobronchial Wang needle biopsies from bronchus intermedius 2.  Endobronchial forceps biopsies from bronchus intermedius  Plans:  The patient will be discharged from the PACU to home when recovered from anesthesia and after chest x-ray is reviewed. We will review the cytology, pathology and microbiology results with the patient when they become available. Outpatient followup will be with Dr Lamonte Sakai.    Baltazar Apo, MD, PhD 07/13/2021, 4:35 PM [facility] [phone] or if no answer before 7:00PM call [phone] For any issues after 7:00PM please call eLink [phone]

## 2021-07-13 NOTE — Anesthesia Procedure Notes (Signed)
Procedure Name: Intubation Date/Time: 07/13/2021 3:05 PM Performed by: Moshe Salisbury, CRNA Pre-anesthesia Checklist: Patient identified, Emergency Drugs available, Suction available and Patient being monitored Patient Re-evaluated:Patient Re-evaluated prior to induction Oxygen Delivery Method: Circle system utilized Preoxygenation: Pre-oxygenation with 100% oxygen Induction Type: IV induction Ventilation: Mask ventilation without difficulty Laryngoscope Size: Miller and 2 Grade View: Grade I Tube type: Oral Tube size: 8.5 mm Number of attempts: 1 Airway Equipment and Method: Stylet Placement Confirmation: ETT inserted through vocal cords under direct vision, positive ETCO2, CO2 detector and breath sounds checked- equal and bilateral Secured at: 22 cm Tube secured with: Tape Dental Injury: Teeth and Oropharynx as per pre-operative assessment

## 2021-07-13 NOTE — Discharge Instructions (Signed)
Flexible Bronchoscopy, Care After This sheet gives you information about how to care for yourself after your test. Your doctor may also give you more specific instructions. If you have problems or questions, contact your doctor. Follow these instructions at home: Eating and drinking Do not eat or drink anything (not even water) for 2 hours after your test, or until your numbing medicine (local anesthetic) wears off. When your numbness is gone and your cough and gag reflexes have come back, you may: Eat only soft foods. Slowly drink liquids. The day after the test, go back to your normal diet. Driving Do not drive for 24 hours if you were given a medicine to help you relax (sedative). Do not drive or use heavy machinery while taking prescription pain medicine. General instructions  Take over-the-counter and prescription medicines only as told by your doctor. Return to your normal activities as told. Ask what activities are safe for you. Do not use any products that have nicotine or tobacco in them. This includes cigarettes and e-cigarettes. If you need help quitting, ask your doctor. Keep all follow-up visits as told by your doctor. This is important. It is very important if you had a tissue sample (biopsy) taken. Get help right away if: You have shortness of breath that gets worse. You get light-headed. You feel like you are going to pass out (faint). You have chest pain. You cough up: More than a little blood. More blood than before. Summary Do not eat or drink anything (not even water) for 2 hours after your test, or until your numbing medicine wears off. Do not use cigarettes. Do not use e-cigarettes. Get help right away if you have chest pain.  Please call our office for any questions or concerns.  [phone].  This information is not intended to replace advice given to you by your health care provider. Make sure you discuss any questions you have with your health care  provider. Document Released: 08/01/2009 Document Revised: 09/16/2017 Document Reviewed: 10/22/2016 Elsevier Patient Education  2020 [employer].

## 2021-07-13 NOTE — Transfer of Care (Signed)
Immediate Anesthesia Transfer of Care Note  Patient: Mark Ewing  Procedure(s) Performed: ROBOTIC ASSISTED VIDEO BRONCHOSCOPY WITH ENDOBRONCHIAL NAVIGATION (Right) BRONCHIAL BIOPSIES BRONCHIAL BRUSHINGS BRONCHIAL NEEDLE ASPIRATION BIOPSIES VIDEO BRONCHOSCOPY WITH RADIAL ENDOBRONCHIAL ULTRASOUND  Patient Location: PACU  Anesthesia Type:General  Level of Consciousness: drowsy and patient cooperative  Airway & Oxygen Therapy: Patient Spontanous Breathing and Patient connected to nasal cannula oxygen  Post-op Assessment: Report given to RN, Post -op Vital signs reviewed and stable and Patient moving all extremities  Post vital signs: Reviewed and stable  Last Vitals:  Vitals Value Taken Time  BP 125/65 07/13/21 1641  Temp    Pulse 66 07/13/21 1643  Resp 17 07/13/21 1643  SpO2 100 % 07/13/21 1643  Vitals shown include unvalidated device data.  Last Pain:  Vitals:   07/13/21 1035  TempSrc:   PainSc: 0-No pain         Complications: No notable events documented.

## 2021-07-13 NOTE — Interval H&P Note (Signed)
History and Physical Interval Note:  07/13/2021 2:58 PM  Mark Ewing  has presented today for surgery, with the diagnosis of PULMONARY NODULES RIGHT UPPER LOBE.  The various methods of treatment have been discussed with the patient and family. After consideration of risks, benefits and other options for treatment, the patient has consented to  Procedure(s): ROBOTIC Index (Right) as a surgical intervention.  The patient's history has been reviewed, patient examined, no change in status, stable for surgery.  I have reviewed the patient's chart and labs.  Questions were answered to the patient's satisfaction.     Collene Gobble

## 2021-07-13 NOTE — Anesthesia Preprocedure Evaluation (Addendum)
Anesthesia Evaluation  Patient identified by MRN, date of birth, ID band Patient awake    Reviewed: Allergy & Precautions, NPO status , Patient's Chart, lab work & pertinent test results, reviewed documented beta blocker date and time   Airway Mallampati: I  TM Distance: >3 FB Neck ROM: Full    Dental no notable dental hx. (+) Teeth Intact, Dental Advisory Given, Caps   Pulmonary  Pulmonary nodules RUL   Pulmonary exam normal breath sounds clear to auscultation       Cardiovascular hypertension, Pt. on medications Normal cardiovascular exam Rhythm:Regular Rate:Normal  Echo 05/31/21 1. Left ventricular ejection fraction, by estimation, is 55 to 60%. The left ventricle has normal function. The left ventricle has no regional wall motion abnormalities. Left ventricular diastolic parameters are consistent with Grade I diastolic dysfunction (impaired relaxation).  2. Right ventricular systolic function is normal. The right ventricular size is normal. Tricuspid regurgitation signal is inadequate for assessing PA pressure.  3. The mitral valve is grossly normal. No evidence of mitral valve regurgitation. No evidence of mitral stenosis.  4. The aortic valve is tricuspid. There is mild calcification of the aortic valve. Aortic valve regurgitation is not visualized. Mild aortic valve sclerosis is present, with no evidence of aortic valve stenosis.    EKG 06/01/21 NSR with PVC's   Neuro/Psych negative neurological ROS  negative psych ROS   GI/Hepatic Neg liver ROS, GERD  Medicated and Controlled,  Endo/Other  Hypothyroidism Hyperlipidemia  Renal/GU negative Renal ROS  negative genitourinary   Musculoskeletal negative musculoskeletal ROS (+)   Abdominal   Peds  Hematology negative hematology ROS (+)   Anesthesia Other Findings   Reproductive/Obstetrics                            Anesthesia  Physical Anesthesia Plan  ASA: 2  Anesthesia Plan: General   Post-op Pain Management:    Induction: Intravenous  PONV Risk Score and Plan: 3 and Treatment may vary due to age or medical condition, Ondansetron and Dexamethasone  Airway Management Planned: Oral ETT  Additional Equipment:   Intra-op Plan:   Post-operative Plan: Extubation in OR  Informed Consent: I have reviewed the patients History and Physical, chart, labs and discussed the procedure including the risks, benefits and alternatives for the proposed anesthesia with the patient or authorized representative who has indicated his/her understanding and acceptance.     Dental advisory given  Plan Discussed with: CRNA and Anesthesiologist  Anesthesia Plan Comments:         Anesthesia Quick Evaluation

## 2021-07-15 ENCOUNTER — Encounter (HOSPITAL_COMMUNITY): Payer: Self-pay | Admitting: Emergency Medicine

## 2021-07-15 NOTE — Anesthesia Postprocedure Evaluation (Signed)
Anesthesia Post Note  Patient: Mark Ewing  Procedure(s) Performed: ROBOTIC ASSISTED VIDEO BRONCHOSCOPY WITH ENDOBRONCHIAL NAVIGATION (Right) BRONCHIAL BIOPSIES BRONCHIAL BRUSHINGS BRONCHIAL NEEDLE ASPIRATION BIOPSIES VIDEO BRONCHOSCOPY WITH RADIAL ENDOBRONCHIAL ULTRASOUND     Patient location during evaluation: PACU Anesthesia Type: General Level of consciousness: awake and alert Pain management: pain level controlled Vital Signs Assessment: post-procedure vital signs reviewed and stable Respiratory status: spontaneous breathing, nonlabored ventilation, respiratory function stable and patient connected to nasal cannula oxygen Cardiovascular status: blood pressure returned to baseline and stable Postop Assessment: no apparent nausea or vomiting Anesthetic complications: no   No notable events documented.  Last Vitals:  Vitals:   07/13/21 1655 07/13/21 1710  BP: 114/70 131/66  Pulse: 71 64  Resp: (!) 24 14  Temp:  (!) 36.2 C  SpO2: 100% 98%    Last Pain:  Vitals:   07/13/21 1710  TempSrc:   PainSc: 0-No pain                 Tiajuana Amass

## 2021-07-16 NOTE — Telephone Encounter (Signed)
Bronch performed on 07/13/21. Will close encounter.

## 2021-07-20 ENCOUNTER — Telehealth: Payer: Self-pay | Admitting: Emergency Medicine

## 2021-07-20 DIAGNOSIS — R918 Other nonspecific abnormal finding of lung field: Secondary | ICD-10-CM

## 2021-07-20 LAB — SURGICAL PATHOLOGY

## 2021-07-20 NOTE — Telephone Encounter (Signed)
Prelim info from pathology suggests possible stromal cells. He has a hx of stromal cell skin CA that was surgically removed in 01/2019. His records say malignant stromal cell neoplasm. The sgy was done at the [facility].   I will call him back when we have the final path.

## 2021-07-21 DIAGNOSIS — U071 COVID-19: Secondary | ICD-10-CM | POA: Diagnosis not present

## 2021-07-21 LAB — CYTOLOGY - NON PAP

## 2021-07-21 NOTE — Telephone Encounter (Signed)
Reviewed the bx results with the pt > poorly differentiated carcinoma with appearance spindle cell, sarcomatoid cells. The IHC was non-specific.   Original scalp resection done [facility] [address] >> malignant spindle cell skin CA.   Plan to refer to [facility] to get guidance on therapy options.

## 2021-07-24 ENCOUNTER — Ambulatory Visit: Payer: Medicare HMO | Admitting: Oncology

## 2021-08-12 ENCOUNTER — Other Ambulatory Visit: Payer: Self-pay

## 2021-08-12 ENCOUNTER — Inpatient Hospital Stay: Payer: Medicare HMO | Attending: Oncology | Admitting: Oncology

## 2021-08-12 VITALS — BP 142/64 | HR 75 | Temp 98.1°F | Resp 16 | Ht 68.0 in | Wt 165.1 lb

## 2021-08-12 DIAGNOSIS — C801 Malignant (primary) neoplasm, unspecified: Secondary | ICD-10-CM | POA: Diagnosis not present

## 2021-08-12 NOTE — Progress Notes (Deleted)
.  fs

## 2021-08-12 NOTE — Progress Notes (Signed)
Reason for the request:    Poorly differentiated skin neoplasm  HPI: I was asked by Dr. Lamonte Sakai to evaluate Mark Ewing for the evaluation of poorly differentiated carcinoma of the lung.  He is a 80 year old man with history of hypertension, hyperlipidemia without any significant comorbid conditions and was diagnosed with skin cancer in 2020.  At that time he noted a scalp lesion and underwent surgical resection at the surgery center in April 2020.  The final pathology was consistent consistent with poorly differentiated cancer and spindle cell features.  He did not require or receive any additional treatment at that time.  He has subsequently hospitalized in August 2022 after presenting with symptoms of gastroenteritis and had underwent laparoscopic cholecystectomy previously.  During his evaluation he underwent CT angiogram of the chest abdomen pelvis to rule out aortic dissection in May 30, 2021.  The scan showed 2 separate nodules in the right upper lobe of the lung suspicious for malignancy.  The larger nodule measuring 2.0 x 2.3 cm and the other 1 measuring 1.3 x 1.1.  PET scan obtained on 06/25/2021 showed hypermetabolic activity in the right upper lobe mass as well as the smaller right upper lobe nodule compatible with malignancy.  No other areas of hypermetabolism noted.  He underwent bronchoscopy and biopsy of the right upper lobe pulmonary nodule completed on 07/13/2021.  If final pathology showed poorly differentiated carcinoma with appearance of spindle cell neoplasm and sarcomatoid features that are nonspecific.  Clinically, he is asymptomatic at this time.  He denies any nausea, vomiting or abdominal pain.  He denies any respiratory issues including cough, wheezes hemoptysis.  Denies any dyspnea on exertion.  He denies any other skin malignancy.   He does not report any headaches, blurry vision, syncope or seizures. Does not report any fevers, chills or sweats.  Does not report any cough,  wheezing or hemoptysis.  Does not report any chest pain, palpitation, orthopnea or leg edema.  Does not report any nausea, vomiting or abdominal pain.  Does not report any constipation or diarrhea.  Does not report any skeletal complaints.    Does not report frequency, urgency or hematuria.  Does not report any skin rashes or lesions. Does not report any heat or cold intolerance.  Does not report any lymphadenopathy or petechiae.  Does not report any anxiety or depression.  Remaining review of systems is negative.     Past Medical History:  Diagnosis Date   Cancer [facility])    per patient "malignant spindle cell neoplasm on top of scalp"   Carotid artery stenosis    2/54/27 [place]: 06-23% LICA stenosis by velocities, CTA neck rec that showed no sign LICA stenosis   Hyperlipidemia    Hypertension    Hypothyroidism    Inguinal hernia    Mixed hyperlipidemia 05/30/2021   Symptomatic cholelithiasis    Thyroid disease    Thyroid nodule   :   Past Surgical History:  Procedure Laterality Date   BRONCHIAL BIOPSY  07/13/2021   Procedure: BRONCHIAL BIOPSIES;  Surgeon: Mark Gobble, MD;  Location: [place];  Service: Pulmonary;;   BRONCHIAL BRUSHINGS  07/13/2021   Procedure: BRONCHIAL BRUSHINGS;  Surgeon: Mark Gobble, MD;  Location: [place];  Service: Pulmonary;;   BRONCHIAL NEEDLE ASPIRATION BIOPSY  07/13/2021   Procedure: BRONCHIAL NEEDLE ASPIRATION BIOPSIES;  Surgeon: Mark Gobble, MD;  Location: [facility];  Service: Pulmonary;;   CATARACT EXTRACTION W/ INTRAOCULAR LENS  IMPLANT, BILATERAL     CHOLECYSTECTOMY  N/A 06/03/2019   Procedure: LAPAROSCOPIC CHOLECYSTECTOMY WITH INTRAOPERATIVE CHOLANGIOGRAM;  Surgeon: Donnie Mesa, MD;  Location: [place];  Service: General;  Laterality: N/A;   HERNIA REPAIR     TONSILLECTOMY     VIDEO BRONCHOSCOPY WITH ENDOBRONCHIAL NAVIGATION Right 07/13/2021   Procedure: ROBOTIC ASSISTED VIDEO BRONCHOSCOPY WITH ENDOBRONCHIAL NAVIGATION;  Surgeon: Mark Gobble, MD;  Location: [facility];  Service: Pulmonary;  Laterality: Right;   VIDEO BRONCHOSCOPY WITH RADIAL ENDOBRONCHIAL ULTRASOUND  07/13/2021   Procedure: VIDEO BRONCHOSCOPY WITH RADIAL ENDOBRONCHIAL ULTRASOUND;  Surgeon: Mark Gobble, MD;  Location: [facility];  Service: Pulmonary;;  :   Current Outpatient Medications:    acetaminophen (TYLENOL) 500 MG tablet, Take 1,000 mg by mouth every 6 (six) hours as needed for moderate pain., Disp: , Rfl:    aspirin 81 MG tablet, Take 81 mg by mouth daily., Disp: , Rfl:    co-enzyme Q-10 50 MG capsule, Take 100 mg by mouth daily., Disp: , Rfl:    famotidine (PEPCID) 20 MG tablet, Take 20 mg by mouth 2 (two) times daily., Disp: , Rfl:    latanoprost (XALATAN) 0.005 % ophthalmic solution, Place 1 drop into both eyes at bedtime., Disp: , Rfl:    levothyroxine (SYNTHROID) 100 MCG tablet, Take 100 mcg by mouth daily before breakfast., Disp: , Rfl:    loratadine (CLARITIN) 10 MG tablet, Take 10 mg by mouth daily., Disp: , Rfl:    Multiple Vitamins-Minerals (MULTIVITAMIN WITH MINERALS) tablet, Take 1 tablet by mouth daily., Disp: , Rfl:    Omega-3 1000 MG CAPS, Take 1,000 mg by mouth daily., Disp: , Rfl:    simvastatin (ZOCOR) 10 MG tablet, Take 10 mg by mouth daily., Disp: , Rfl:    verapamil (CALAN-SR) 180 MG CR tablet, Take 180 mg by mouth 2 (two) times daily. , Disp: , Rfl: :  No Known Allergies:   Family History  Problem Relation Age of Onset   Hypertension Mother    Ovarian cancer Mother    Hypertension Father    CVA Father    Prostate cancer Father    Hypertension Brother    Hypertension Paternal Grandfather    CVA Paternal Grandfather   :   Social History   Socioeconomic History   Marital status: Married    Spouse name: Not on file   Number of children: Not on file   Years of education: Not on file   Highest education level: Not on file  Occupational History   Not on file  Tobacco Use   Smoking status: Never    Smokeless tobacco: Never  Vaping Use   Vaping Use: Never used  Substance and Sexual Activity   Alcohol use: No   Drug use: No   Sexual activity: Not on file  Other Topics Concern   Not on file  Social History Narrative   Not on file   Social Determinants of Health   Financial Resource Strain: Not on file  Food Insecurity: Not on file  Transportation Needs: Not on file  Physical Activity: Not on file  Stress: Not on file  Social Connections: Not on file  Intimate Partner Violence: Not on file  :  Pertinent items are noted in HPI.  Exam: Blood pressure (!) 142/64, pulse 75, temperature 98.1 F (36.7 C), temperature source Oral, resp. rate 16, height 5\' 8"  (1.727 m), weight 165 lb 1.6 oz (74.9 kg), SpO2 99 %. General appearance: alert and cooperative appeared without distress. Head: atraumatic without any  abnormalities. Eyes: conjunctivae/corneas clear. PERRL.  Sclera anicteric. Throat: lips, mucosa, and tongue normal; without oral thrush or ulcers. Resp: clear to auscultation bilaterally without rhonchi, wheezes or dullness to percussion. Cardio: regular rate and rhythm, S1, S2 normal, no murmur, click, rub or gallop GI: soft, non-tender; bowel sounds normal; no masses,  no organomegaly Skin: Skin color, texture, turgor normal. No rashes or lesions Lymph nodes: Cervical, supraclavicular, and axillary nodes normal. Neurologic: Grossly normal without any motor, sensory or deep tendon reflexes. Musculoskeletal: No joint deformity or effusion.     Assessment and Plan:    80 year old with:  1.  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  The pathology is likely consistent with his previous scalp lesion that was removed in 2020 without any evidence of additional area of metastasis.  The natural course of this disease, imaging studies and treatment options were  discussed today with the patient and his family via phone.  This appears to be consistent with advanced carcinoma arising from a skin primary with isolated pulmonary areas of metastasis.  Definitive treatment choices including surgical resection of these tumors would potentially offer curative and definitive diagnostic approach.  The logistics as well as the complication from extensive lung surgery was discussed and certainly not ruled out but could incur a lot of morbidity.  Alternative option would be radiation therapy as a definitive modality for the isolated lesions.  The role for systemic therapy at this time was discussed.  It would be certainly an option for palliative purposes and will likely not offer a curative goal.  Before proceeding with any systemic therapy confirmatory biopsy may be required of the large pulmonary lesion and certainly a multidisciplinary discussion and comparing his current biopsy with the previous one.  Systemic therapy options including anthracycline based therapy presumably for a spindle cell neoplasm versus immunotherapy as an alternative.  After discussion today, the plan is for him to be discussed in tumor board in the next 48 hours including discussion about treatment options as well as review of his pathology and will make appropriate referral to radiation oncology for possible radiation treatment.  I also urged him to pursue a second opinion which she is already doing next week.  Upon completing his consultation and evaluation he will return to discuss final treatment choice.  2.  Follow-up: We will be in the next few weeks to follow his progress.  60  minutes were dedicated to this visit. The time was spent on reviewing  imaging studies, discussing treatment options, discussing differential diagnosis reviewing pathology results and answering questions regarding future plan.     A copy of this consult has been forwarded to the requesting physician.

## 2021-08-13 ENCOUNTER — Encounter: Payer: Self-pay | Admitting: Emergency Medicine

## 2021-08-13 ENCOUNTER — Ambulatory Visit: Payer: Medicare HMO | Admitting: Emergency Medicine

## 2021-08-13 ENCOUNTER — Ambulatory Visit (INDEPENDENT_AMBULATORY_CARE_PROVIDER_SITE_OTHER): Payer: Medicare HMO | Admitting: Emergency Medicine

## 2021-08-13 DIAGNOSIS — R918 Other nonspecific abnormal finding of lung field: Secondary | ICD-10-CM | POA: Diagnosis not present

## 2021-08-13 LAB — PULMONARY FUNCTION TEST
DL/VA % pred: 114 %
DL/VA: 4.51 ml/min/mmHg/L
DLCO cor % pred: 76 %
DLCO cor: 17.53 ml/min/mmHg
DLCO unc % pred: 71 %
DLCO unc: 16.57 ml/min/mmHg
FEF 25-75 Post: 3.16 L/sec
FEF 25-75 Pre: 2.58 L/sec
FEF2575-%Change-Post: 22 %
FEF2575-%Pred-Post: 171 %
FEF2575-%Pred-Pre: 140 %
FEV1-%Change-Post: 3 %
FEV1-%Pred-Post: 95 %
FEV1-%Pred-Pre: 92 %
FEV1-Post: 2.54 L
FEV1-Pre: 2.46 L
FEV1FVC-%Change-Post: 0 %
FEV1FVC-%Pred-Pre: 114 %
FEV6-%Change-Post: 1 %
FEV6-%Pred-Post: 87 %
FEV6-%Pred-Pre: 86 %
FEV6-Post: 3.03 L
FEV6-Pre: 3 L
FEV6FVC-%Change-Post: -2 %
FEV6FVC-%Pred-Post: 104 %
FEV6FVC-%Pred-Pre: 107 %
FVC-%Change-Post: 3 %
FVC-%Pred-Post: 83 %
FVC-%Pred-Pre: 80 %
FVC-Post: 3.11 L
FVC-Pre: 3 L
Post FEV1/FVC ratio: 82 %
Post FEV6/FVC ratio: 97 %
Pre FEV1/FVC ratio: 82 %
Pre FEV6/FVC Ratio: 100 %
RV % pred: 90 %
RV: 2.29 L
TLC % pred: 70 %
TLC: 4.66 L

## 2021-08-13 NOTE — Progress Notes (Signed)
Subjective:    Patient ID: Mark Ewing, male    DOB: Jul 25, 1941, 80 y.o.   MRN: [id number]  HPI 80 year old gentleman, never smoker, with a history of spindle cell skin cancer (scalp, removed 2020), carotid disease, hypertension, hyperlipidemia, thyroid nodule.  He is referred today by Dr. Rockwell Germany for evaluation of an abnormal CT scan of the chest. He was in the emergency department 05/30/2021 with dizziness and a syncopal episode, assoc w GI bleeding and abdominal pain beginning the day prior.  A CT scan of his chest abdomen pelvis was performed as below as part of his evaluation. He reports no CP, SOB. He is able to be quite active, works in the garden. He does have some orthostasis, kneeling to standing. Minimal cough.    CT chest 05/30/2021 reviewed by me shows a lobulated 13 x 11 mm peripheral right upper lobe pulmonary nodule, spiculated anterior right upper lobe nodule 2.0 x 2.3 cm.    ROV 07/02/21 --Mark Ewing 80 with a history of hypertension, carotid disease, spindle cell skin cancer (surgically treated).  He is here today to follow pulmonary nodular disease that was discovered by CT chest 05/30/2021. He feels well. Had a reassuring CSY and EGD on 9/14 >> gastritis and internal hemorrhoid. No L rib tenderness or correlate with area of hypermetabolism on his PET scan  PET scan 06/25/2021 reviewed by me shows there is hypermetabolism and is 2.8 x 3.0 cm anterior right upper lobe nodule, hypermetabolism in the 1.4 x 1.3 cm right upper lobe apical nodule.  There is some mild accentuated activity in the left abdominal loop of small bowel that was felt to be most likely physiologic. ? Also a L 7th rib hypermetabolism with no clear lytic correlate.    ROV 08/13/21 --80 year old man follows up today for pulmonary nodular disease.  He had a history of spindle cell skin cancer on the scalp that was treated surgically.  He underwent robotic assisted navigational bronchoscopy 07/13/2021 to evaluate  2.8 x 3.0 anterior right upper lobe nodule, 1.4 x 1.3 cm right upper lobe apical nodule.  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  He has seen Dr. Alen Blew and current plan is to pursue XRT. He is going to get a 2nd opinion at [facility]  Pulmonary function testing from today reviewed by me, show normal airflows without a bronchodilator response, restriction based on a decreased TLC, decreased diffusion capacity that corrects to normal range when adjusted for his alveolar volume.   Review of Systems As per HPI  Past Medical History:  Diagnosis Date   Cancer ([place])    per patient "malignant spindle cell neoplasm on top of scalp"   Carotid artery stenosis    6/37/85 [place]: 88-50% LICA stenosis by velocities, CTA neck rec that showed no sign LICA stenosis   Hyperlipidemia    Hypertension    Hypothyroidism    Inguinal hernia    Mixed hyperlipidemia 05/30/2021   Symptomatic cholelithiasis    Thyroid disease    Thyroid nodule      Family History  Problem Relation Age of Onset   Hypertension Mother    Ovarian cancer Mother    Hypertension Father    CVA Father    Prostate cancer Father    Hypertension Brother    Hypertension Paternal Grandfather    CVA Paternal Grandfather      Social History   Socioeconomic History   Marital status: Married    Spouse name:  Not on file   Number of children: Not on file   Years of education: Not on file   Highest education level: Not on file  Occupational History   Not on file  Tobacco Use   Smoking status: Never   Smokeless tobacco: Never  Vaping Use   Vaping Use: Never used  Substance and Sexual Activity   Alcohol use: No   Drug use: No   Sexual activity: Not on file  Other Topics Concern   Not on file  Social History Narrative   Not on file   Social Determinants of Health   Financial Resource Strain: Not on file  Food Insecurity: Not on file  Transportation Needs: Not on file   Physical Activity: Not on file  Stress: Not on file  Social Connections: Not on file  Intimate Partner Violence: Not on file     No Known Allergies   Outpatient Medications Prior to Visit  Medication Sig Dispense Refill   acetaminophen (TYLENOL) 500 MG tablet Take 1,000 mg by mouth every 6 (six) hours as needed for moderate pain.     aspirin 81 MG tablet Take 81 mg by mouth daily.     co-enzyme Q-10 50 MG capsule Take 100 mg by mouth daily.     famotidine (PEPCID) 20 MG tablet Take 20 mg by mouth 2 (two) times daily.     latanoprost (XALATAN) 0.005 % ophthalmic solution Place 1 drop into both eyes at bedtime.     levothyroxine (SYNTHROID) 100 MCG tablet Take 100 mcg by mouth daily before breakfast.     loratadine (CLARITIN) 10 MG tablet Take 10 mg by mouth daily.     Multiple Vitamins-Minerals (MULTIVITAMIN WITH MINERALS) tablet Take 1 tablet by mouth daily.     Omega-3 1000 MG CAPS Take 1,000 mg by mouth daily.     simvastatin (ZOCOR) 10 MG tablet Take 10 mg by mouth daily.     verapamil (CALAN-SR) 180 MG CR tablet Take 180 mg by mouth 2 (two) times daily.      No facility-administered medications prior to visit.         Objective:   Physical Exam Vitals:   08/13/21 1343  BP: 128/72  Pulse: 80  SpO2: 96%  Weight: 165 lb 6.4 oz (75 kg)  Height: 5\' 8"  (1.727 m)   Gen: Pleasant, well-nourished elderly man, in no distress,  normal affect  ENT: No lesions,  mouth clear,  oropharynx clear, no postnasal drip  Neck: No JVD, no stridor  Lungs: No use of accessory muscles, no crackles or wheezing on normal respiration, no wheeze on forced expiration  Cardiovascular: RRR, heart sounds normal, no murmur or gallops, no peripheral edema  Musculoskeletal: No deformities, no cyanosis or clubbing  Neuro: alert, awake, non focal  Skin: Warm, no lesions or rash      Assessment & Plan:   Pulmonary nodules/lesions, multiple Biopsy performed, pathology consistent with  metastatic spindle cell cancer.  He has been seen by Dr. Alen Blew to review options.  He is also going to get a second opinion at [facility].  Then treatment plan will be made.  We will try to get a copy of his PET scan on a disc so he can take it with him to [facility].  His pulmonary function testing is reassuring, so he would be a surgical candidate if that was the appropriate therapeutic path to take.  Baltazar Apo, MD, PhD 08/13/2021, 2:01 PM [facility] [phone] or if no answer before 7:00PM call [phone] For any issues after 7:00PM please call eLink [phone]

## 2021-08-13 NOTE — Progress Notes (Signed)
Full PFT completed today ? ?

## 2021-08-13 NOTE — Assessment & Plan Note (Signed)
Biopsy performed, pathology consistent with metastatic spindle cell cancer.  He has been seen by Dr. Alen Blew to review options.  He is also going to get a second opinion at [facility].  Then treatment plan will be made.  We will try to get a copy of his PET scan on a disc so he can take it with him to [facility].  His pulmonary function testing is reassuring, so he would be a surgical candidate if that was the appropriate therapeutic path to take.

## 2021-08-13 NOTE — Patient Instructions (Signed)
Follow-up as planned with Dr. Alen Blew and with oncology at [facility] We will request a disc copy of your PET scan from radiology at [place] can pick this up to take with you to your [facility] visit. We reviewed your pulmonary function testing today.  This showed overall normal airflows, no evidence for significant COPD. Follow with Dr Lamonte Sakai if needed

## 2021-08-17 DIAGNOSIS — C7801 Secondary malignant neoplasm of right lung: Secondary | ICD-10-CM | POA: Diagnosis not present

## 2021-08-17 DIAGNOSIS — C490 Malignant neoplasm of connective and soft tissue of head, face and neck: Secondary | ICD-10-CM | POA: Diagnosis not present

## 2021-08-17 DIAGNOSIS — C3491 Malignant neoplasm of unspecified part of right bronchus or lung: Secondary | ICD-10-CM | POA: Diagnosis not present

## 2021-08-17 DIAGNOSIS — H401131 Primary open-angle glaucoma, bilateral, mild stage: Secondary | ICD-10-CM | POA: Diagnosis not present

## 2021-08-17 DIAGNOSIS — C3411 Malignant neoplasm of upper lobe, right bronchus or lung: Secondary | ICD-10-CM | POA: Diagnosis not present

## 2021-08-17 DIAGNOSIS — Z85831 Personal history of malignant neoplasm of soft tissue: Secondary | ICD-10-CM | POA: Diagnosis not present

## 2021-08-18 ENCOUNTER — Telehealth: Payer: Self-pay | Admitting: Emergency Medicine

## 2021-08-18 NOTE — Telephone Encounter (Signed)
LM informing Mark Ewing paper work is on its way.   Nothing further needed at this time.

## 2021-08-19 ENCOUNTER — Ambulatory Visit: Payer: Medicare HMO

## 2021-08-19 ENCOUNTER — Ambulatory Visit: Payer: Medicare HMO | Admitting: Radiation Oncology

## 2021-08-19 DIAGNOSIS — C4440 Unspecified malignant neoplasm of skin of scalp and neck: Secondary | ICD-10-CM | POA: Diagnosis not present

## 2021-08-28 DIAGNOSIS — R911 Solitary pulmonary nodule: Secondary | ICD-10-CM | POA: Diagnosis not present

## 2021-08-28 DIAGNOSIS — C4440 Unspecified malignant neoplasm of skin of scalp and neck: Secondary | ICD-10-CM | POA: Diagnosis not present

## 2021-08-28 DIAGNOSIS — Z85828 Personal history of other malignant neoplasm of skin: Secondary | ICD-10-CM | POA: Diagnosis not present

## 2021-08-28 DIAGNOSIS — Z01818 Encounter for other preprocedural examination: Secondary | ICD-10-CM | POA: Diagnosis not present

## 2021-08-31 ENCOUNTER — Ambulatory Visit: Payer: Medicare HMO | Admitting: Oncology

## 2021-09-03 DIAGNOSIS — C3411 Malignant neoplasm of upper lobe, right bronchus or lung: Secondary | ICD-10-CM | POA: Diagnosis not present

## 2021-09-03 DIAGNOSIS — C3491 Malignant neoplasm of unspecified part of right bronchus or lung: Secondary | ICD-10-CM | POA: Diagnosis not present

## 2021-09-18 DIAGNOSIS — C499 Malignant neoplasm of connective and soft tissue, unspecified: Secondary | ICD-10-CM | POA: Diagnosis not present

## 2021-09-18 DIAGNOSIS — Z9189 Other specified personal risk factors, not elsewhere classified: Secondary | ICD-10-CM | POA: Diagnosis not present

## 2021-09-18 DIAGNOSIS — I491 Atrial premature depolarization: Secondary | ICD-10-CM | POA: Diagnosis not present

## 2021-09-18 DIAGNOSIS — R911 Solitary pulmonary nodule: Secondary | ICD-10-CM | POA: Diagnosis not present

## 2021-09-18 DIAGNOSIS — Z01818 Encounter for other preprocedural examination: Secondary | ICD-10-CM | POA: Diagnosis not present

## 2021-09-18 DIAGNOSIS — R9439 Abnormal result of other cardiovascular function study: Secondary | ICD-10-CM | POA: Diagnosis not present

## 2021-09-18 DIAGNOSIS — R7989 Other specified abnormal findings of blood chemistry: Secondary | ICD-10-CM | POA: Diagnosis not present

## 2021-09-18 DIAGNOSIS — I493 Ventricular premature depolarization: Secondary | ICD-10-CM | POA: Diagnosis not present

## 2021-09-18 DIAGNOSIS — C7801 Secondary malignant neoplasm of right lung: Secondary | ICD-10-CM | POA: Diagnosis not present

## 2021-09-21 DIAGNOSIS — E785 Hyperlipidemia, unspecified: Secondary | ICD-10-CM | POA: Diagnosis not present

## 2021-09-21 DIAGNOSIS — R9439 Abnormal result of other cardiovascular function study: Secondary | ICD-10-CM | POA: Diagnosis not present

## 2021-09-21 DIAGNOSIS — Z85828 Personal history of other malignant neoplasm of skin: Secondary | ICD-10-CM | POA: Diagnosis not present

## 2021-09-21 DIAGNOSIS — I2510 Atherosclerotic heart disease of native coronary artery without angina pectoris: Secondary | ICD-10-CM | POA: Diagnosis not present

## 2021-09-21 DIAGNOSIS — I10 Essential (primary) hypertension: Secondary | ICD-10-CM | POA: Diagnosis not present

## 2021-09-21 DIAGNOSIS — Z01818 Encounter for other preprocedural examination: Secondary | ICD-10-CM | POA: Diagnosis not present

## 2021-09-25 DIAGNOSIS — R9439 Abnormal result of other cardiovascular function study: Secondary | ICD-10-CM | POA: Diagnosis not present

## 2021-09-25 DIAGNOSIS — I2510 Atherosclerotic heart disease of native coronary artery without angina pectoris: Secondary | ICD-10-CM | POA: Diagnosis not present

## 2021-10-13 DIAGNOSIS — C7801 Secondary malignant neoplasm of right lung: Secondary | ICD-10-CM | POA: Diagnosis not present

## 2021-10-13 DIAGNOSIS — Z01818 Encounter for other preprocedural examination: Secondary | ICD-10-CM | POA: Diagnosis not present

## 2021-11-05 DIAGNOSIS — Z85828 Personal history of other malignant neoplasm of skin: Secondary | ICD-10-CM | POA: Diagnosis not present

## 2021-11-05 DIAGNOSIS — R911 Solitary pulmonary nodule: Secondary | ICD-10-CM | POA: Diagnosis not present

## 2021-11-05 DIAGNOSIS — C7801 Secondary malignant neoplasm of right lung: Secondary | ICD-10-CM | POA: Diagnosis not present

## 2021-11-05 DIAGNOSIS — I10 Essential (primary) hypertension: Secondary | ICD-10-CM | POA: Diagnosis not present

## 2021-11-05 DIAGNOSIS — Z79899 Other long term (current) drug therapy: Secondary | ICD-10-CM | POA: Diagnosis not present

## 2021-11-05 DIAGNOSIS — Z7982 Long term (current) use of aspirin: Secondary | ICD-10-CM | POA: Diagnosis not present

## 2021-11-12 DIAGNOSIS — E039 Hypothyroidism, unspecified: Secondary | ICD-10-CM | POA: Diagnosis not present

## 2021-11-12 DIAGNOSIS — E782 Mixed hyperlipidemia: Secondary | ICD-10-CM | POA: Diagnosis not present

## 2021-11-12 DIAGNOSIS — K219 Gastro-esophageal reflux disease without esophagitis: Secondary | ICD-10-CM | POA: Diagnosis not present

## 2021-11-12 DIAGNOSIS — M1611 Unilateral primary osteoarthritis, right hip: Secondary | ICD-10-CM | POA: Diagnosis not present

## 2021-11-12 DIAGNOSIS — I10 Essential (primary) hypertension: Secondary | ICD-10-CM | POA: Diagnosis not present

## 2021-11-12 DIAGNOSIS — R911 Solitary pulmonary nodule: Secondary | ICD-10-CM | POA: Diagnosis not present

## 2021-11-12 DIAGNOSIS — C771 Secondary and unspecified malignant neoplasm of intrathoracic lymph nodes: Secondary | ICD-10-CM | POA: Diagnosis not present

## 2021-11-16 DIAGNOSIS — C4440 Unspecified malignant neoplasm of skin of scalp and neck: Secondary | ICD-10-CM | POA: Diagnosis not present

## 2021-11-16 DIAGNOSIS — C7801 Secondary malignant neoplasm of right lung: Secondary | ICD-10-CM | POA: Diagnosis not present

## 2021-11-17 DIAGNOSIS — C7801 Secondary malignant neoplasm of right lung: Secondary | ICD-10-CM | POA: Diagnosis not present

## 2021-11-17 DIAGNOSIS — C4440 Unspecified malignant neoplasm of skin of scalp and neck: Secondary | ICD-10-CM | POA: Diagnosis not present

## 2021-11-20 DIAGNOSIS — K562 Volvulus: Secondary | ICD-10-CM | POA: Diagnosis not present

## 2021-11-20 DIAGNOSIS — C7801 Secondary malignant neoplasm of right lung: Secondary | ICD-10-CM | POA: Diagnosis not present

## 2021-11-20 DIAGNOSIS — R933 Abnormal findings on diagnostic imaging of other parts of digestive tract: Secondary | ICD-10-CM | POA: Diagnosis not present

## 2021-11-27 ENCOUNTER — Other Ambulatory Visit: Payer: Self-pay

## 2021-11-27 ENCOUNTER — Encounter (HOSPITAL_BASED_OUTPATIENT_CLINIC_OR_DEPARTMENT_OTHER): Payer: Self-pay

## 2021-11-27 ENCOUNTER — Emergency Department (HOSPITAL_BASED_OUTPATIENT_CLINIC_OR_DEPARTMENT_OTHER)
Admission: EM | Admit: 2021-11-27 | Discharge: 2021-11-27 | Disposition: A | Discharge status: Home / Self Care | Payer: Medicare HMO | Attending: Emergency Medicine | Admitting: Emergency Medicine

## 2021-11-27 ENCOUNTER — Emergency Department (HOSPITAL_BASED_OUTPATIENT_CLINIC_OR_DEPARTMENT_OTHER): Payer: Medicare HMO

## 2021-11-27 DIAGNOSIS — I6502 Occlusion and stenosis of left vertebral artery: Secondary | ICD-10-CM | POA: Diagnosis not present

## 2021-11-27 DIAGNOSIS — R42 Dizziness and giddiness: Secondary | ICD-10-CM | POA: Insufficient documentation

## 2021-11-27 DIAGNOSIS — H532 Diplopia: Secondary | ICD-10-CM | POA: Insufficient documentation

## 2021-11-27 DIAGNOSIS — M542 Cervicalgia: Secondary | ICD-10-CM | POA: Insufficient documentation

## 2021-11-27 DIAGNOSIS — Z7982 Long term (current) use of aspirin: Secondary | ICD-10-CM | POA: Insufficient documentation

## 2021-11-27 DIAGNOSIS — R531 Weakness: Secondary | ICD-10-CM | POA: Diagnosis not present

## 2021-11-27 DIAGNOSIS — C801 Malignant (primary) neoplasm, unspecified: Secondary | ICD-10-CM

## 2021-11-27 DIAGNOSIS — C7931 Secondary malignant neoplasm of brain: Secondary | ICD-10-CM | POA: Diagnosis not present

## 2021-11-27 DIAGNOSIS — R9431 Abnormal electrocardiogram [ECG] [EKG]: Secondary | ICD-10-CM | POA: Diagnosis not present

## 2021-11-27 DIAGNOSIS — R202 Paresthesia of skin: Secondary | ICD-10-CM | POA: Diagnosis not present

## 2021-11-27 DIAGNOSIS — I6523 Occlusion and stenosis of bilateral carotid arteries: Secondary | ICD-10-CM | POA: Diagnosis not present

## 2021-11-27 DIAGNOSIS — R791 Abnormal coagulation profile: Secondary | ICD-10-CM | POA: Diagnosis not present

## 2021-11-27 DIAGNOSIS — Z20822 Contact with and (suspected) exposure to covid-19: Secondary | ICD-10-CM | POA: Insufficient documentation

## 2021-11-27 DIAGNOSIS — C499 Malignant neoplasm of connective and soft tissue, unspecified: Secondary | ICD-10-CM | POA: Diagnosis not present

## 2021-11-27 LAB — CBC
HCT: 31.6 % — ABNORMAL LOW (ref 39.0–52.0)
Hemoglobin: 10.2 g/dL — ABNORMAL LOW (ref 13.0–17.0)
MCH: 26.6 pg (ref 26.0–34.0)
MCHC: 32.3 g/dL (ref 30.0–36.0)
MCV: 82.5 fL (ref 80.0–100.0)
Platelets: 241 10*3/uL (ref 150–400)
RBC: 3.83 MIL/uL — ABNORMAL LOW (ref 4.22–5.81)
RDW: 14 % (ref 11.5–15.5)
WBC: 6.9 10*3/uL (ref 4.0–10.5)
nRBC: 0 % (ref 0.0–0.2)

## 2021-11-27 LAB — RAPID URINE DRUG SCREEN, HOSP PERFORMED
Amphetamines: NOT DETECTED
Barbiturates: NOT DETECTED
Benzodiazepines: NOT DETECTED
Cocaine: NOT DETECTED
Opiates: NOT DETECTED
Tetrahydrocannabinol: NOT DETECTED

## 2021-11-27 LAB — COMPREHENSIVE METABOLIC PANEL
ALT: 14 U/L (ref 0–44)
AST: 24 U/L (ref 15–41)
Albumin: 3.5 g/dL (ref 3.5–5.0)
Alkaline Phosphatase: 46 U/L (ref 38–126)
Anion gap: 8 (ref 5–15)
BUN: 19 mg/dL (ref 8–23)
CO2: 26 mmol/L (ref 22–32)
Calcium: 8.7 mg/dL — ABNORMAL LOW (ref 8.9–10.3)
Chloride: 97 mmol/L — ABNORMAL LOW (ref 98–111)
Creatinine, Ser: 0.94 mg/dL (ref 0.61–1.24)
GFR, Estimated: >60 mL/min (ref >60)
Glucose, Bld: 100 mg/dL — ABNORMAL HIGH (ref 70–99)
Potassium: 3.6 mmol/L (ref 3.5–5.1)
Sodium: 131 mmol/L — ABNORMAL LOW (ref 135–145)
Total Bilirubin: 0.5 mg/dL (ref 0.3–1.2)
Total Protein: 6 g/dL — ABNORMAL LOW (ref 6.5–8.1)

## 2021-11-27 LAB — URINALYSIS, ROUTINE W REFLEX MICROSCOPIC
Bilirubin Urine: NEGATIVE
Glucose, UA: NEGATIVE mg/dL
Hgb urine dipstick: NEGATIVE
Ketones, ur: NEGATIVE mg/dL
Leukocytes,Ua: NEGATIVE
Nitrite: NEGATIVE
Protein, ur: NEGATIVE mg/dL
Specific Gravity, Urine: 1.025 (ref 1.005–1.030)
pH: 6.5 (ref 5.0–8.0)

## 2021-11-27 LAB — DIFFERENTIAL
Abs Immature Granulocytes: 0.02 10*3/uL (ref 0.00–0.07)
Basophils Absolute: 0 10*3/uL (ref 0.0–0.1)
Basophils Relative: 0 %
Eosinophils Absolute: 0.1 10*3/uL (ref 0.0–0.5)
Eosinophils Relative: 1 %
Immature Granulocytes: 0 %
Lymphocytes Relative: 18 %
Lymphs Abs: 1.3 10*3/uL (ref 0.7–4.0)
Monocytes Absolute: 1 10*3/uL (ref 0.1–1.0)
Monocytes Relative: 15 %
Neutro Abs: 4.5 10*3/uL (ref 1.7–7.7)
Neutrophils Relative %: 66 %

## 2021-11-27 LAB — PROTIME-INR
INR: 1 (ref 0.8–1.2)
Prothrombin Time: 12.7 seconds (ref 11.4–15.2)

## 2021-11-27 LAB — RESP PANEL BY RT-PCR (FLU A&B, COVID) ARPGX2
Influenza A by PCR: NEGATIVE
Influenza B by PCR: NEGATIVE
SARS Coronavirus 2 by RT PCR: NEGATIVE

## 2021-11-27 LAB — APTT: aPTT: 28 seconds (ref 24–36)

## 2021-11-27 LAB — ETHANOL: Alcohol, Ethyl (B): <10 mg/dL (ref <10)

## 2021-11-27 MED ORDER — IOHEXOL 350 MG/ML SOLN
80.0000 mL | Freq: Once | INTRAVENOUS | Status: AC | PRN
  Administered 2021-11-27: 80 mL via INTRAVENOUS

## 2021-11-27 MED ORDER — DEXAMETHASONE 4 MG PO TABS
4.0000 mg | ORAL_TABLET | Freq: Once | ORAL | Status: AC
  Administered 2021-11-27: 4 mg via ORAL
  Filled 2021-11-27: qty 1

## 2021-11-27 MED ORDER — LEVETIRACETAM 500 MG PO TABS: 500.0000 mg | ORAL_TABLET | Freq: Two times a day (BID) | ORAL | 0 refills | Status: AC

## 2021-11-27 MED ORDER — DEXAMETHASONE 4 MG PO TABS: 4.0000 mg | ORAL_TABLET | Freq: Four times a day (QID) | ORAL | 0 refills | Status: AC

## 2021-11-27 MED ORDER — LEVETIRACETAM 500 MG PO TABS
500.0000 mg | ORAL_TABLET | Freq: Once | ORAL | Status: AC
  Administered 2021-11-27: 500 mg via ORAL
  Filled 2021-11-27: qty 1

## 2021-11-27 NOTE — ED Notes (Signed)
Patient discharged to home.  All discharge instructions reviewed.  Patient verbalized understanding via teachback method.  VS WDL.  Respirations even and unlabored.  Ambulatory out of ED.   °

## 2021-11-27 NOTE — ED Triage Notes (Addendum)
Pt reports that he had dizziness and double vision on Sunday which resolved in 2-3 hours. Noted numbness in 3 fingers on left hand. Pt reports numbness and tingling to entire left arm from shoulder to hand since Wednesday. Left shoulder pain since Wednesday. Feels like he has been sleeping on shoulder

## 2021-11-27 NOTE — Discharge Instructions (Signed)
Please follow-up closely with your oncology team.  Dr. Kendall Flack said he would arrange for follow-up with the radiation specialist at [facility] as well as getting a brain MRI next week.  If you do not hear anything by Monday morning, please contact his office.  Both Dr. Kendall Flack and our neurosurgeon recommend taking Decadron to help with the swelling.  Additionally they recommend taking Keppra to prevent seizures.  Do not drive for now.  Please discuss with your oncology and radiation specialist the long-term duration of the steroids.  If you are on steroids for a prolonged amount of time, you generally need to be tapered off slowly.  Remember to take your famotidine.  If at any point you feel that your neurologic symptoms are worsening, specifically developing recurrent double vision, worsening numbness, any weakness or other new concerning symptom, please go to the emergency room for reassessment.

## 2021-11-27 NOTE — ED Provider Notes (Signed)
[place] EMERGENCY DEPARTMENT Provider Note   CSN: [id number] Arrival date & time: 11/27/21  1346     History  Chief Complaint  Patient presents with   Numbness    Mark Ewing is a 81 y.o. male.  81  yo M with a chief complaints of diplopia dizziness and numbness and weakness to his left upper extremity.  This started on Saturday and the dizziness and double vision had improved but he has had persistent weakness and numbness mostly along the ulnar aspect of his left arm.  Has had some trouble gripping things specifically remembers trying use floss and having difficulty.  He had some discomfort at the base of his neck as well.  This has persisted.  He denies trauma to the head of the neck.  Denies confusion denies lower extremity weakness.  Denies difficulty with eating or drinking.       Home Medications Prior to Admission medications   Medication Sig Start Date End Date Taking? Authorizing Provider  acetaminophen (TYLENOL) 500 MG tablet Take 1,000 mg by mouth every 6 (six) hours as needed for moderate pain.    [provider]  aspirin 81 MG tablet Take 81 mg by mouth daily.    [provider]  co-enzyme Q-10 50 MG capsule Take 100 mg by mouth daily.    [provider]  famotidine (PEPCID) 20 MG tablet Take 20 mg by mouth 2 (two) times daily.    [provider]  latanoprost (XALATAN) 0.005 % ophthalmic solution Place 1 drop into both eyes at bedtime. 03/23/21   [provider]  levothyroxine (SYNTHROID) 100 MCG tablet Take 100 mcg by mouth daily before breakfast.    [provider]  loratadine (CLARITIN) 10 MG tablet Take 10 mg by mouth daily.    [provider]  Multiple Vitamins-Minerals (MULTIVITAMIN WITH MINERALS) tablet Take 1 tablet by mouth daily.    [provider]  Omega-3 1000 MG CAPS Take 1,000 mg by mouth daily.    [provider]  simvastatin (ZOCOR) 10 MG tablet Take 10  mg by mouth daily.    [provider]  verapamil (CALAN-SR) 180 MG CR tablet Take 180 mg by mouth 2 (two) times daily.     [provider]      Allergies    Patient has no known allergies.    Review of Systems   Review of Systems  Physical Exam Updated Vital Signs BP 135/62    Pulse 69    Temp 98 F (36.7 C) (Oral)    Resp 18    Wt 73.5 kg    SpO2 100%    BMI 24.63 kg/m  Physical Exam Vitals and nursing note reviewed.  Constitutional:      Appearance: He is well-developed.  HENT:     Head: Normocephalic and atraumatic.  Eyes:     Pupils: Pupils are equal, round, and reactive to light.  Neck:     Vascular: No JVD.  Cardiovascular:     Rate and Rhythm: Normal rate and regular rhythm.     Heart sounds: No murmur heard.   No friction rub. No gallop.  Pulmonary:     Effort: No respiratory distress.     Breath sounds: No wheezing.  Abdominal:     General: There is no distension.     Tenderness: There is no abdominal tenderness. There is no guarding or rebound.  Musculoskeletal:        General:  Normal range of motion.     Cervical back: Normal range of motion and neck supple.  Skin:    Coloration: Skin is not pale.     Findings: No rash.  Neurological:     Mental Status: He is alert and oriented to person, place, and time.     Comments: Past-pointing with the left upper extremity.  Mild weakness mostly along the third fourth and fifth digit of the left hand with grip strength has some weakness with extension with the left upper extremity.  Strength is 4 out of 5 compared to 5 out of 5 on the right.  Pulse motor and sensation intact to the extremity.  No obvious pain along the trapezius no pain with range of motion of the shoulder negative Tinel's negative Spurling's  Psychiatric:        Behavior: Behavior normal.    ED Results / Procedures / Treatments   Labs (all labs ordered are listed, but only abnormal results are displayed) Labs Reviewed  CBC -  Abnormal; Notable for the following components:      Result Value   RBC 3.83 (*)    Hemoglobin 10.2 (*)    HCT 31.6 (*)    All other components within normal limits  RESP PANEL BY RT-PCR (FLU A&B, COVID) ARPGX2  PROTIME-INR  APTT  DIFFERENTIAL  RAPID URINE DRUG SCREEN, HOSP PERFORMED  URINALYSIS, ROUTINE W REFLEX MICROSCOPIC  ETHANOL  COMPREHENSIVE METABOLIC PANEL    EKG None  Radiology No results found.  Procedures Procedures    Medications Ordered in ED Medications - No data to display  ED Course/ Medical Decision Making/ A&P                           Medical Decision Making Amount and/or Complexity of Data Reviewed Labs: ordered. Radiology: ordered.   81 yo M with a chief complaints of acute onsets of dizziness double vision and numbness to the left upper extremity.  His symptoms have improved but his numbness and weakness to the left upper extremity have persisted.  His neuro exam is not normal for me with the left upper extremity has some weakness with grip strength and is well with extension of the left upper extremity.  He has no obvious pain to the neck or shoulder or elbow.  We will obtain a CT angiogram of the head and neck to assess for possible carotid or vertebral artery dissection.  If this is negative likely will discuss with neurology about obtaining MRI versus admission.  Patient care was signed out to Dr. Roslynn Amble, please see his note for further details care in the ED.  The patients results and plan were reviewed and discussed.   Any x-rays performed were independently reviewed by myself.   Differential diagnosis were considered with the presenting HPI.  Medications - No data to display  Vitals:   11/27/21 1356 11/27/21 1404 11/27/21 1430  BP: (!) 144/62  135/62  Pulse: 83  69  Resp: 17  18  Temp: 98 F (36.7 C)    TempSrc: Oral    SpO2: 100%  100%  Weight:  73.5 kg     Final diagnoses:  Weakness           Final Clinical  Impression(s) / ED Diagnoses Final diagnoses:  Weakness    Rx / DC Orders ED Discharge Orders     None         Tyrone Nine,  Penelopi Mikrut, DO 11/27/21 1545

## 2021-11-27 NOTE — ED Provider Notes (Signed)
Sign out note  81 year old gentleman with concern for dizziness, double vision on Sunday, has persistent tingling/numbness in left upper extremity.  Received signout from Dr. Tyrone Nine.  Plan to check CTA head and neck, basic labs.  CTA head and neck is concerning for new 1.3 cm right frontal mass, likely metastatic disease with mild to moderate edema.  I discussed the CT findings and patient's presentation with Dr. Arnoldo Morale on-call for neurosurgery.  Patient has history of spindle cell carcinoma, followed at [facility].  He recommends starting Decadron for swelling and Keppra for seizure prophylaxis but given the current size of lesion and patient's relatively mild symptoms he is comfortable with patient being managed in the outpatient setting from a neurosurgical perspective.  I discussed the case with patient's oncologist, Dr. Kendall Flack.  He agrees that patient is appropriate for outpatient management at present.  Recommends doing Decadron 4 mg every 6 hours for now and agrees with the [facility].  Patient is already on famotidine for GI prophylaxis.  Dr. Kendall Flack will arrange for close follow-up with their clinic and evaluation by radiation oncology, MRI this coming week.  Lengthy discussion with patient, wife and son over the phone regarding the CT findings and the discussions I had with neurosurgery and his oncologist.  All are in agreement and demonstrated good understanding of very strict return precautions should any of his neurologic complaints worsen.  Currently his strength and sensation is intact and he appears quite well and believe this is a reasonable plan and agree with neurosurgery and oncology that he does not need hospital admission at present though I stressed that should his condition worsen, he may need admission.  Provided initial dose of Keppra and p.o. Decadron here.  CRITICAL CARE Performed by: Lucrezia Starch   Total critical care time: 34 minutes  Critical care time was exclusive of  separately billable procedures and treating other patients.  Critical care was necessary to treat or prevent imminent or life-threatening deterioration.  Critical care was time spent personally by me on the following activities: development of treatment plan with patient and/or surrogate as well as nursing, discussions with consultants, evaluation of patient's response to treatment, examination of patient, obtaining history from patient or surrogate, ordering and performing treatments and interventions, ordering and review of laboratory studies, ordering and review of radiographic studies, pulse oximetry and re-evaluation of patient's condition.     Lucrezia Starch, MD 11/27/21 2252

## 2021-11-29 DIAGNOSIS — C7801 Secondary malignant neoplasm of right lung: Secondary | ICD-10-CM | POA: Diagnosis not present

## 2021-11-29 DIAGNOSIS — C7931 Secondary malignant neoplasm of brain: Secondary | ICD-10-CM | POA: Diagnosis not present

## 2021-11-29 DIAGNOSIS — C801 Malignant (primary) neoplasm, unspecified: Secondary | ICD-10-CM | POA: Diagnosis not present

## 2021-12-01 DIAGNOSIS — Z888 Allergy status to other drugs, medicaments and biological substances status: Secondary | ICD-10-CM | POA: Diagnosis not present

## 2021-12-01 DIAGNOSIS — C7801 Secondary malignant neoplasm of right lung: Secondary | ICD-10-CM | POA: Diagnosis not present

## 2021-12-01 DIAGNOSIS — C7931 Secondary malignant neoplasm of brain: Secondary | ICD-10-CM | POA: Diagnosis not present

## 2021-12-14 DIAGNOSIS — C7931 Secondary malignant neoplasm of brain: Secondary | ICD-10-CM | POA: Diagnosis not present

## 2021-12-15 DIAGNOSIS — C499 Malignant neoplasm of connective and soft tissue, unspecified: Secondary | ICD-10-CM | POA: Diagnosis not present

## 2021-12-15 DIAGNOSIS — Z510 Encounter for antineoplastic radiation therapy: Secondary | ICD-10-CM | POA: Diagnosis not present

## 2021-12-15 DIAGNOSIS — C7931 Secondary malignant neoplasm of brain: Secondary | ICD-10-CM | POA: Diagnosis not present

## 2021-12-18 DIAGNOSIS — I10 Essential (primary) hypertension: Secondary | ICD-10-CM | POA: Diagnosis not present

## 2021-12-18 DIAGNOSIS — R600 Localized edema: Secondary | ICD-10-CM | POA: Diagnosis not present

## 2021-12-18 DIAGNOSIS — G4700 Insomnia, unspecified: Secondary | ICD-10-CM | POA: Diagnosis not present

## 2021-12-18 DIAGNOSIS — C3490 Malignant neoplasm of unspecified part of unspecified bronchus or lung: Secondary | ICD-10-CM | POA: Diagnosis not present

## 2021-12-18 DIAGNOSIS — C7931 Secondary malignant neoplasm of brain: Secondary | ICD-10-CM | POA: Diagnosis not present

## 2021-12-21 ENCOUNTER — Other Ambulatory Visit: Payer: Self-pay

## 2021-12-21 ENCOUNTER — Emergency Department (HOSPITAL_BASED_OUTPATIENT_CLINIC_OR_DEPARTMENT_OTHER)
Admission: EM | Admit: 2021-12-21 | Discharge: 2021-12-22 | Disposition: A | Discharge status: Other Acute Inpatient Hospital | Payer: Medicare HMO | Attending: Emergency Medicine | Admitting: Emergency Medicine

## 2021-12-21 ENCOUNTER — Encounter (HOSPITAL_BASED_OUTPATIENT_CLINIC_OR_DEPARTMENT_OTHER): Payer: Self-pay | Admitting: *Deleted

## 2021-12-21 DIAGNOSIS — E039 Hypothyroidism, unspecified: Secondary | ICD-10-CM | POA: Insufficient documentation

## 2021-12-21 DIAGNOSIS — I10 Essential (primary) hypertension: Secondary | ICD-10-CM | POA: Diagnosis not present

## 2021-12-21 DIAGNOSIS — M7989 Other specified soft tissue disorders: Secondary | ICD-10-CM | POA: Diagnosis not present

## 2021-12-21 DIAGNOSIS — Z7982 Long term (current) use of aspirin: Secondary | ICD-10-CM | POA: Diagnosis not present

## 2021-12-21 DIAGNOSIS — Z85118 Personal history of other malignant neoplasm of bronchus and lung: Secondary | ICD-10-CM | POA: Diagnosis not present

## 2021-12-21 DIAGNOSIS — Z79899 Other long term (current) drug therapy: Secondary | ICD-10-CM | POA: Insufficient documentation

## 2021-12-21 DIAGNOSIS — Z85841 Personal history of malignant neoplasm of brain: Secondary | ICD-10-CM | POA: Insufficient documentation

## 2021-12-21 DIAGNOSIS — E871 Hypo-osmolality and hyponatremia: Secondary | ICD-10-CM | POA: Diagnosis not present

## 2021-12-21 LAB — COMPREHENSIVE METABOLIC PANEL
ALT: 31 U/L (ref 0–44)
AST: 27 U/L (ref 15–41)
Albumin: 2.9 g/dL — ABNORMAL LOW (ref 3.5–5.0)
Alkaline Phosphatase: 58 U/L (ref 38–126)
Anion gap: 9 (ref 5–15)
BUN: 24 mg/dL — ABNORMAL HIGH (ref 8–23)
CO2: 24 mmol/L (ref 22–32)
Calcium: 7.7 mg/dL — ABNORMAL LOW (ref 8.9–10.3)
Chloride: 84 mmol/L — ABNORMAL LOW (ref 98–111)
Creatinine, Ser: 0.75 mg/dL (ref 0.61–1.24)
GFR, Estimated: >60 mL/min (ref >60)
Glucose, Bld: 108 mg/dL — ABNORMAL HIGH (ref 70–99)
Potassium: 4.1 mmol/L (ref 3.5–5.1)
Sodium: 117 mmol/L — CL (ref 135–145)
Total Bilirubin: 0.7 mg/dL (ref 0.3–1.2)
Total Protein: 4.7 g/dL — ABNORMAL LOW (ref 6.5–8.1)

## 2021-12-21 LAB — CBC WITH DIFFERENTIAL/PLATELET
Abs Immature Granulocytes: 0.05 10*3/uL (ref 0.00–0.07)
Basophils Absolute: 0 10*3/uL (ref 0.0–0.1)
Basophils Relative: 0 %
Eosinophils Absolute: 0 10*3/uL (ref 0.0–0.5)
Eosinophils Relative: 0 %
HCT: 29.8 % — ABNORMAL LOW (ref 39.0–52.0)
Hemoglobin: 10.4 g/dL — ABNORMAL LOW (ref 13.0–17.0)
Immature Granulocytes: 1 %
Lymphocytes Relative: 3 %
Lymphs Abs: 0.3 10*3/uL — ABNORMAL LOW (ref 0.7–4.0)
MCH: 26.9 pg (ref 26.0–34.0)
MCHC: 34.9 g/dL (ref 30.0–36.0)
MCV: 77 fL — ABNORMAL LOW (ref 80.0–100.0)
Monocytes Absolute: 0.7 10*3/uL (ref 0.1–1.0)
Monocytes Relative: 7 %
Neutro Abs: 9.6 10*3/uL — ABNORMAL HIGH (ref 1.7–7.7)
Neutrophils Relative %: 89 %
Platelets: 60 10*3/uL — ABNORMAL LOW (ref 150–400)
RBC: 3.87 MIL/uL — ABNORMAL LOW (ref 4.22–5.81)
RDW: 17.4 % — ABNORMAL HIGH (ref 11.5–15.5)
Smear Review: DECREASED
WBC: 10.7 10*3/uL — ABNORMAL HIGH (ref 4.0–10.5)
nRBC: 0 % (ref 0.0–0.2)

## 2021-12-21 LAB — MAGNESIUM: Magnesium: 1.8 mg/dL (ref 1.7–2.4)

## 2021-12-21 LAB — URINALYSIS, ROUTINE W REFLEX MICROSCOPIC
Bilirubin Urine: NEGATIVE
Glucose, UA: NEGATIVE mg/dL
Hgb urine dipstick: NEGATIVE
Ketones, ur: NEGATIVE mg/dL
Leukocytes,Ua: NEGATIVE
Nitrite: NEGATIVE
Protein, ur: NEGATIVE mg/dL
Specific Gravity, Urine: 1.015 (ref 1.005–1.030)
pH: 7 (ref 5.0–8.0)

## 2021-12-21 LAB — PHOSPHORUS: Phosphorus: 2.7 mg/dL (ref 2.5–4.6)

## 2021-12-21 MED ORDER — SODIUM CHLORIDE 0.9 % IV BOLUS
1000.0000 mL | Freq: Once | INTRAVENOUS | Status: AC
  Administered 2021-12-21: 1000 mL via INTRAVENOUS

## 2021-12-21 MED ORDER — LEVETIRACETAM 500 MG PO TABS
500.0000 mg | ORAL_TABLET | Freq: Once | ORAL | Status: AC
  Administered 2021-12-21: 500 mg via ORAL
  Filled 2021-12-21: qty 1

## 2021-12-21 MED ORDER — DEXAMETHASONE 4 MG PO TABS
4.0000 mg | ORAL_TABLET | Freq: Once | ORAL | Status: AC
  Administered 2021-12-21: 4 mg via ORAL
  Filled 2021-12-21: qty 1

## 2021-12-21 MED ORDER — MORPHINE SULFATE (PF) 2 MG/ML IV SOLN
1.0000 mg | Freq: Once | INTRAVENOUS | Status: AC
  Administered 2021-12-21: 1 mg via INTRAVENOUS
  Filled 2021-12-21: qty 1

## 2021-12-21 NOTE — ED Provider Notes (Signed)
?[facility] ?Provider Note ? ? ?CSN: [id number] ?Arrival date & time: 12/21/21  1916 ? ?  ? ?History ? ?Chief Complaint  ?Patient presents with  ? Hypertension  ? ? ?Mark Ewing is a 81 y.o. male. Patient presents to the emergency department complaining of feeling "unwell" with swollen ankles for the past few days. The patient has been weaning from decadron and states that he usually feels unwell when this happens. 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  Oncologist is Dr. Dorothey Baseman in [place].  ? ?HPI ? ?  ? ?Home Medications ?Prior to Admission medications   ?Medication Sig Start Date End Date Taking? Authorizing Provider  ?acetaminophen (TYLENOL) 500 MG tablet Take 1,000 mg by mouth every 6 (six) hours as needed for moderate pain.    [provider]  ?aspirin 81 MG tablet Take 81 mg by mouth daily.    [provider]  ?co-enzyme Q-10 50 MG capsule Take 100 mg by mouth daily.    [provider]  ?dexamethasone (DECADRON) 4 MG tablet Take 1 tablet (4 mg total) by mouth 4 (four) times daily. 11/27/21   Lucrezia Starch, MD  ?famotidine (PEPCID) 20 MG tablet Take 20 mg by mouth 2 (two) times daily.    [provider]  ?latanoprost (XALATAN) 0.005 % ophthalmic solution Place 1 drop into both eyes at bedtime. 03/23/21   [provider]  ?levETIRAcetam (KEPPRA) 500 MG tablet Take 1 tablet (500 mg total) by mouth 2 (two) times daily. 11/27/21   Lucrezia Starch, MD  ?levothyroxine (SYNTHROID) 100 MCG tablet Take 100 mcg by mouth daily before breakfast.    [provider]  ?loratadine (CLARITIN) 10 MG tablet Take 10 mg by mouth daily.    [provider]  ?Multiple Vitamins-Minerals (MULTIVITAMIN WITH MINERALS) tablet Take 1 tablet by mouth daily.    [provider]  ?Omega-3 1000 MG CAPS Take 1,000 mg by mouth daily.    [provider]  ?simvastatin (ZOCOR) 10 MG tablet Take 10 mg by mouth daily.    [provider]  ?verapamil (CALAN-SR) 180 MG CR tablet Take 180 mg by mouth 2 (two) times daily.     [provider]  ?   ? ?Allergies    ?Patient has no known allergies.   ? ?Review of Systems   ?Review of Systems  ?Constitutional:  Positive for fatigue. Negative for fever.  ?Respiratory:  Positive for shortness of breath.   ?Cardiovascular:  Positive for leg swelling. Negative for chest pain.  ?Gastrointestinal:  Negative for abdominal pain.  ?Genitourinary:  Negative for dysuria.  ?Skin:  Negative for color change.  ? ?Physical Exam ?Updated Vital Signs ?BP (!) 161/75   Pulse 70   Temp 97.6 ?F (36.4 ?C) (Oral)   Resp 14   Ht 5\' 8"  (1.727 m)   Wt 73.5 kg   SpO2 99%   BMI 24.64 kg/m?  ?Physical Exam ? ?ED Results / Procedures / Treatments   ?Labs ?(all labs ordered are listed, but only abnormal results are displayed) ?Labs Reviewed  ?CBC WITH DIFFERENTIAL/PLATELET - Abnormal; Notable for the following components:  ?    Result Value  ? WBC 10.7 (*)   ? RBC 3.87 (*)   ? Hemoglobin 10.4 (*)   ? HCT 29.8 (*)   ? MCV 77.0 (*)   ? RDW 17.4 (*)   ?  Platelets 60 (*)   ? Neutro Abs 9.6 (*)   ? Lymphs Abs 0.3 (*)   ? All other components within normal limits  ?COMPREHENSIVE METABOLIC PANEL - Abnormal; Notable for the following components:  ? Sodium 117 (*)   ? Chloride 84 (*)   ? Glucose, Bld 108 (*)   ? BUN 24 (*)   ? Calcium 7.7 (*)   ? Total Protein 4.7 (*)   ? Albumin 2.9 (*)   ? All other components within normal limits  ?MAGNESIUM  ?PHOSPHORUS  ?URINALYSIS, ROUTINE W REFLEX MICROSCOPIC  ?OSMOLALITY, URINE  ?CREATININE, URINE, RANDOM  ?URIC ACID, RANDOM URINE  ?OSMOLALITY  ?TSH  ?CORTISOL  ? ? ?EKG ?None ? ?Radiology ?No results found. ? ?Procedures ?Procedures  ? ? ?Medications Ordered in ED ?Medications  ?dexamethasone (DECADRON) tablet 4 mg (4 mg Oral Given 12/21/21 2135)  ?levETIRAcetam (KEPPRA) tablet 500 mg (500  mg Oral Given 12/21/21 2135)  ?morphine (PF) 2 MG/ML injection 1 mg (1 mg Intravenous Given 12/21/21 2144)  ?sodium chloride 0.9 % bolus 1,000 mL (1,000 mLs Intravenous New Bag/Given 12/21/21 2140)  ? ? ?ED Course/ Medical Decision Making/ A&P ?  ?                        ?Medical Decision Making ?Amount and/or Complexity of Data Reviewed ?Labs: ordered. ? ?Risk ?Prescription drug management. ? ? ?This patient presents to the ED for concern of feeling unwell and leg swelling, this involves an extensive number of treatment options, and is a complaint that carries with it a high risk of complications and morbidity.  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 ? ?Co morbidities that complicate the patient evaluation ? ?Spindle cell sarcoma, current steroid use ? ? ?Additional history obtained: ? ?Additional history obtained from patient's wife ?External records from outside source obtained and reviewed including emergency department notes from 11/27/21 ? ? ?Lab Tests: ? ?I Ordered, and personally interpreted labs.  The pertinent results include:  Na 117, WBC 10.7  ? ? ?Imaging Studies ordered: ? ?None ? ? ?Cardiac Monitoring: ? ?The patient was maintained on a cardiac monitor.  I personally viewed and interpreted the cardiac monitored which showed an underlying rhythm of: normal sinus rhythm ? ? ?Medicines ordered and prescription drug management: ? ?I ordered medication including morphine for pain  ?Reevaluation of the patient after these medicines showed that the patient improved ?I have reviewed the patients home medicines and have made adjustments as needed ? ? ?Test Considered: ? ?Chest x-ray ? ? ?Consultations Obtained: ? ?I requested consultation with the oncologist on call at [facility], Dr. Mindi Junker,  and discussed lab and imaging findings as well as pertinent plan - they recommend: transfer to [facility] ? ?The patient has a chief complaint of feeling "unwell" which has a very broad differential diagnosis. Labs were collected while the patient was waiting for a room which show a sodium level of 117, which is low enough to warrant hospitalization for sodium correction. I ordered other labs to continue evaluation. The patient's wife was adamant that his care be continued at [facility] where his oncology care is provided. I explained that I would reach out and talk to them but that patient acceptance is not guaranteed outside of our system. The patient and wife voiced understanding. I provided the patient's nightly dose  of decadron and Keppra. Dr.Petty with [facility] agreed to accept the patient. Uric acid, urine, osmolality, urine, cortisol, urine creatinine, TSH, and osmolality pending  ?  ? ? ?Final Clinical Impression(s) / ED Diagnoses ?Final diagnoses:  ?Hyponatremia  ? ? ?Rx / DC Orders ?ED Discharge Orders   ? ? None  ? ?  ? ? ?  ?Dorothyann Peng, PA-C ?12/21/21 2328 ? ?  ?Lorelle Gibbs, DO ?12/22/21 1623 ? ?

## 2021-12-21 NOTE — ED Triage Notes (Signed)
Hypertension. Swelling in his feet x 4 days. Pt states he is having withdrawal from steroids. Dosage has been reduced but he has not stopped taking the medication. His reaction is HTN and sweating from his feet per pt.  ?

## 2021-12-22 DIAGNOSIS — R609 Edema, unspecified: Secondary | ICD-10-CM | POA: Diagnosis not present

## 2021-12-22 DIAGNOSIS — I10 Essential (primary) hypertension: Secondary | ICD-10-CM | POA: Diagnosis not present

## 2021-12-22 DIAGNOSIS — E782 Mixed hyperlipidemia: Secondary | ICD-10-CM | POA: Diagnosis not present

## 2021-12-22 DIAGNOSIS — M7989 Other specified soft tissue disorders: Secondary | ICD-10-CM | POA: Diagnosis not present

## 2021-12-22 DIAGNOSIS — R2243 Localized swelling, mass and lump, lower limb, bilateral: Secondary | ICD-10-CM | POA: Diagnosis not present

## 2021-12-22 DIAGNOSIS — C7800 Secondary malignant neoplasm of unspecified lung: Secondary | ICD-10-CM | POA: Diagnosis not present

## 2021-12-22 DIAGNOSIS — C7931 Secondary malignant neoplasm of brain: Secondary | ICD-10-CM | POA: Diagnosis not present

## 2021-12-22 DIAGNOSIS — K219 Gastro-esophageal reflux disease without esophagitis: Secondary | ICD-10-CM | POA: Diagnosis not present

## 2021-12-22 DIAGNOSIS — C490 Malignant neoplasm of connective and soft tissue of head, face and neck: Secondary | ICD-10-CM | POA: Diagnosis not present

## 2021-12-22 DIAGNOSIS — R001 Bradycardia, unspecified: Secondary | ICD-10-CM | POA: Diagnosis not present

## 2021-12-22 DIAGNOSIS — J309 Allergic rhinitis, unspecified: Secondary | ICD-10-CM | POA: Diagnosis not present

## 2021-12-22 DIAGNOSIS — C3490 Malignant neoplasm of unspecified part of unspecified bronchus or lung: Secondary | ICD-10-CM | POA: Diagnosis not present

## 2021-12-22 DIAGNOSIS — E871 Hypo-osmolality and hyponatremia: Secondary | ICD-10-CM | POA: Diagnosis not present

## 2021-12-22 DIAGNOSIS — C7801 Secondary malignant neoplasm of right lung: Secondary | ICD-10-CM | POA: Diagnosis not present

## 2021-12-22 DIAGNOSIS — E222 Syndrome of inappropriate secretion of antidiuretic hormone: Secondary | ICD-10-CM | POA: Diagnosis not present

## 2021-12-22 DIAGNOSIS — E785 Hyperlipidemia, unspecified: Secondary | ICD-10-CM | POA: Diagnosis not present

## 2021-12-22 DIAGNOSIS — E039 Hypothyroidism, unspecified: Secondary | ICD-10-CM | POA: Diagnosis not present

## 2021-12-22 DIAGNOSIS — C4449 Other specified malignant neoplasm of skin of scalp and neck: Secondary | ICD-10-CM | POA: Diagnosis not present

## 2021-12-22 LAB — CREATININE, URINE, RANDOM: Creatinine, Urine: 28.48 mg/dL

## 2021-12-22 LAB — CORTISOL: Cortisol, Plasma: 3 ug/dL

## 2021-12-22 LAB — OSMOLALITY, URINE: Osmolality, Ur: 371 mOsm/kg (ref 300–900)

## 2021-12-22 LAB — TSH: TSH: 0.214 u[IU]/mL — ABNORMAL LOW (ref 0.350–4.500)

## 2021-12-22 LAB — OSMOLALITY: Osmolality: 252 mOsm/kg — ABNORMAL LOW (ref 275–295)

## 2021-12-23 DIAGNOSIS — E871 Hypo-osmolality and hyponatremia: Secondary | ICD-10-CM | POA: Diagnosis not present

## 2021-12-23 DIAGNOSIS — I10 Essential (primary) hypertension: Secondary | ICD-10-CM | POA: Diagnosis not present

## 2021-12-23 DIAGNOSIS — J309 Allergic rhinitis, unspecified: Secondary | ICD-10-CM | POA: Diagnosis not present

## 2021-12-23 DIAGNOSIS — C4449 Other specified malignant neoplasm of skin of scalp and neck: Secondary | ICD-10-CM | POA: Diagnosis not present

## 2021-12-23 DIAGNOSIS — K219 Gastro-esophageal reflux disease without esophagitis: Secondary | ICD-10-CM | POA: Diagnosis not present

## 2021-12-23 DIAGNOSIS — E785 Hyperlipidemia, unspecified: Secondary | ICD-10-CM | POA: Diagnosis not present

## 2021-12-23 DIAGNOSIS — E039 Hypothyroidism, unspecified: Secondary | ICD-10-CM | POA: Diagnosis not present

## 2021-12-23 DIAGNOSIS — C7931 Secondary malignant neoplasm of brain: Secondary | ICD-10-CM | POA: Diagnosis not present

## 2021-12-23 DIAGNOSIS — C7800 Secondary malignant neoplasm of unspecified lung: Secondary | ICD-10-CM | POA: Diagnosis not present

## 2021-12-23 LAB — URIC ACID, RANDOM URINE: Uric Acid, Urine: 18.1 mg/dL

## 2021-12-24 DIAGNOSIS — J309 Allergic rhinitis, unspecified: Secondary | ICD-10-CM | POA: Diagnosis not present

## 2021-12-24 DIAGNOSIS — C7931 Secondary malignant neoplasm of brain: Secondary | ICD-10-CM | POA: Diagnosis not present

## 2021-12-24 DIAGNOSIS — C7801 Secondary malignant neoplasm of right lung: Secondary | ICD-10-CM | POA: Diagnosis not present

## 2021-12-24 DIAGNOSIS — R001 Bradycardia, unspecified: Secondary | ICD-10-CM | POA: Diagnosis not present

## 2021-12-24 DIAGNOSIS — E785 Hyperlipidemia, unspecified: Secondary | ICD-10-CM | POA: Diagnosis not present

## 2021-12-24 DIAGNOSIS — E871 Hypo-osmolality and hyponatremia: Secondary | ICD-10-CM | POA: Diagnosis not present

## 2021-12-24 DIAGNOSIS — E039 Hypothyroidism, unspecified: Secondary | ICD-10-CM | POA: Diagnosis not present

## 2021-12-24 DIAGNOSIS — K219 Gastro-esophageal reflux disease without esophagitis: Secondary | ICD-10-CM | POA: Diagnosis not present

## 2021-12-24 DIAGNOSIS — C4449 Other specified malignant neoplasm of skin of scalp and neck: Secondary | ICD-10-CM | POA: Diagnosis not present

## 2021-12-24 DIAGNOSIS — I10 Essential (primary) hypertension: Secondary | ICD-10-CM | POA: Diagnosis not present

## 2021-12-24 DIAGNOSIS — M7989 Other specified soft tissue disorders: Secondary | ICD-10-CM | POA: Diagnosis not present

## 2021-12-24 DIAGNOSIS — C7800 Secondary malignant neoplasm of unspecified lung: Secondary | ICD-10-CM | POA: Diagnosis not present

## 2021-12-25 DIAGNOSIS — R609 Edema, unspecified: Secondary | ICD-10-CM | POA: Diagnosis not present

## 2021-12-25 DIAGNOSIS — C7931 Secondary malignant neoplasm of brain: Secondary | ICD-10-CM | POA: Diagnosis not present

## 2021-12-25 DIAGNOSIS — E871 Hypo-osmolality and hyponatremia: Secondary | ICD-10-CM | POA: Diagnosis not present

## 2021-12-25 DIAGNOSIS — I10 Essential (primary) hypertension: Secondary | ICD-10-CM | POA: Diagnosis not present

## 2021-12-25 DIAGNOSIS — C3490 Malignant neoplasm of unspecified part of unspecified bronchus or lung: Secondary | ICD-10-CM | POA: Diagnosis not present

## 2021-12-29 DIAGNOSIS — C4449 Other specified malignant neoplasm of skin of scalp and neck: Secondary | ICD-10-CM | POA: Diagnosis not present

## 2021-12-29 DIAGNOSIS — Z8051 Family history of malignant neoplasm of kidney: Secondary | ICD-10-CM | POA: Diagnosis not present

## 2021-12-29 DIAGNOSIS — Z5111 Encounter for antineoplastic chemotherapy: Secondary | ICD-10-CM | POA: Diagnosis not present

## 2021-12-29 DIAGNOSIS — C7931 Secondary malignant neoplasm of brain: Secondary | ICD-10-CM | POA: Diagnosis not present

## 2021-12-29 DIAGNOSIS — Z808 Family history of malignant neoplasm of other organs or systems: Secondary | ICD-10-CM | POA: Diagnosis not present

## 2021-12-29 DIAGNOSIS — Z8041 Family history of malignant neoplasm of ovary: Secondary | ICD-10-CM | POA: Diagnosis not present

## 2021-12-29 DIAGNOSIS — C7801 Secondary malignant neoplasm of right lung: Secondary | ICD-10-CM | POA: Diagnosis not present

## 2021-12-30 DIAGNOSIS — H524 Presbyopia: Secondary | ICD-10-CM | POA: Diagnosis not present

## 2022-01-04 DIAGNOSIS — R1310 Dysphagia, unspecified: Secondary | ICD-10-CM | POA: Diagnosis not present

## 2022-01-04 DIAGNOSIS — I872 Venous insufficiency (chronic) (peripheral): Secondary | ICD-10-CM | POA: Diagnosis not present

## 2022-01-04 DIAGNOSIS — I10 Essential (primary) hypertension: Secondary | ICD-10-CM | POA: Diagnosis not present

## 2022-01-04 DIAGNOSIS — E782 Mixed hyperlipidemia: Secondary | ICD-10-CM | POA: Diagnosis not present

## 2022-01-04 DIAGNOSIS — K5900 Constipation, unspecified: Secondary | ICD-10-CM | POA: Diagnosis not present

## 2022-01-04 DIAGNOSIS — E042 Nontoxic multinodular goiter: Secondary | ICD-10-CM | POA: Diagnosis not present

## 2022-01-04 DIAGNOSIS — R600 Localized edema: Secondary | ICD-10-CM | POA: Diagnosis not present

## 2022-01-04 DIAGNOSIS — E039 Hypothyroidism, unspecified: Secondary | ICD-10-CM | POA: Diagnosis not present

## 2022-01-04 DIAGNOSIS — E871 Hypo-osmolality and hyponatremia: Secondary | ICD-10-CM | POA: Diagnosis not present

## 2022-01-05 DIAGNOSIS — C7931 Secondary malignant neoplasm of brain: Secondary | ICD-10-CM | POA: Diagnosis not present

## 2022-01-05 DIAGNOSIS — L97511 Non-pressure chronic ulcer of other part of right foot limited to breakdown of skin: Secondary | ICD-10-CM | POA: Diagnosis not present

## 2022-01-05 DIAGNOSIS — C3490 Malignant neoplasm of unspecified part of unspecified bronchus or lung: Secondary | ICD-10-CM | POA: Diagnosis not present

## 2022-01-05 DIAGNOSIS — I10 Essential (primary) hypertension: Secondary | ICD-10-CM | POA: Diagnosis not present

## 2022-01-05 DIAGNOSIS — L97521 Non-pressure chronic ulcer of other part of left foot limited to breakdown of skin: Secondary | ICD-10-CM | POA: Diagnosis not present

## 2022-01-05 DIAGNOSIS — M1611 Unilateral primary osteoarthritis, right hip: Secondary | ICD-10-CM | POA: Diagnosis not present

## 2022-01-05 DIAGNOSIS — I700 Atherosclerosis of aorta: Secondary | ICD-10-CM | POA: Diagnosis not present

## 2022-01-05 DIAGNOSIS — S50812D Abrasion of left forearm, subsequent encounter: Secondary | ICD-10-CM | POA: Diagnosis not present

## 2022-01-05 DIAGNOSIS — I872 Venous insufficiency (chronic) (peripheral): Secondary | ICD-10-CM | POA: Diagnosis not present

## 2022-01-11 DIAGNOSIS — R0689 Other abnormalities of breathing: Secondary | ICD-10-CM | POA: Diagnosis not present

## 2022-01-11 DIAGNOSIS — R609 Edema, unspecified: Secondary | ICD-10-CM | POA: Diagnosis not present

## 2022-01-11 DIAGNOSIS — R0902 Hypoxemia: Secondary | ICD-10-CM | POA: Diagnosis not present

## 2022-01-11 DIAGNOSIS — M25519 Pain in unspecified shoulder: Secondary | ICD-10-CM | POA: Diagnosis not present

## 2022-01-11 DIAGNOSIS — I959 Hypotension, unspecified: Secondary | ICD-10-CM | POA: Diagnosis not present

## 2022-01-11 IMAGING — DX DG CHEST 1V PORT
1 series · 1 of 1 positions shown · non-contrast
Comparison: Chest radiograph dated 02/15/2017.

CLINICAL DATA: Concern for cardiomegaly and pulmonary edema.

EXAM:
PORTABLE CHEST 1 VIEW

[chest]
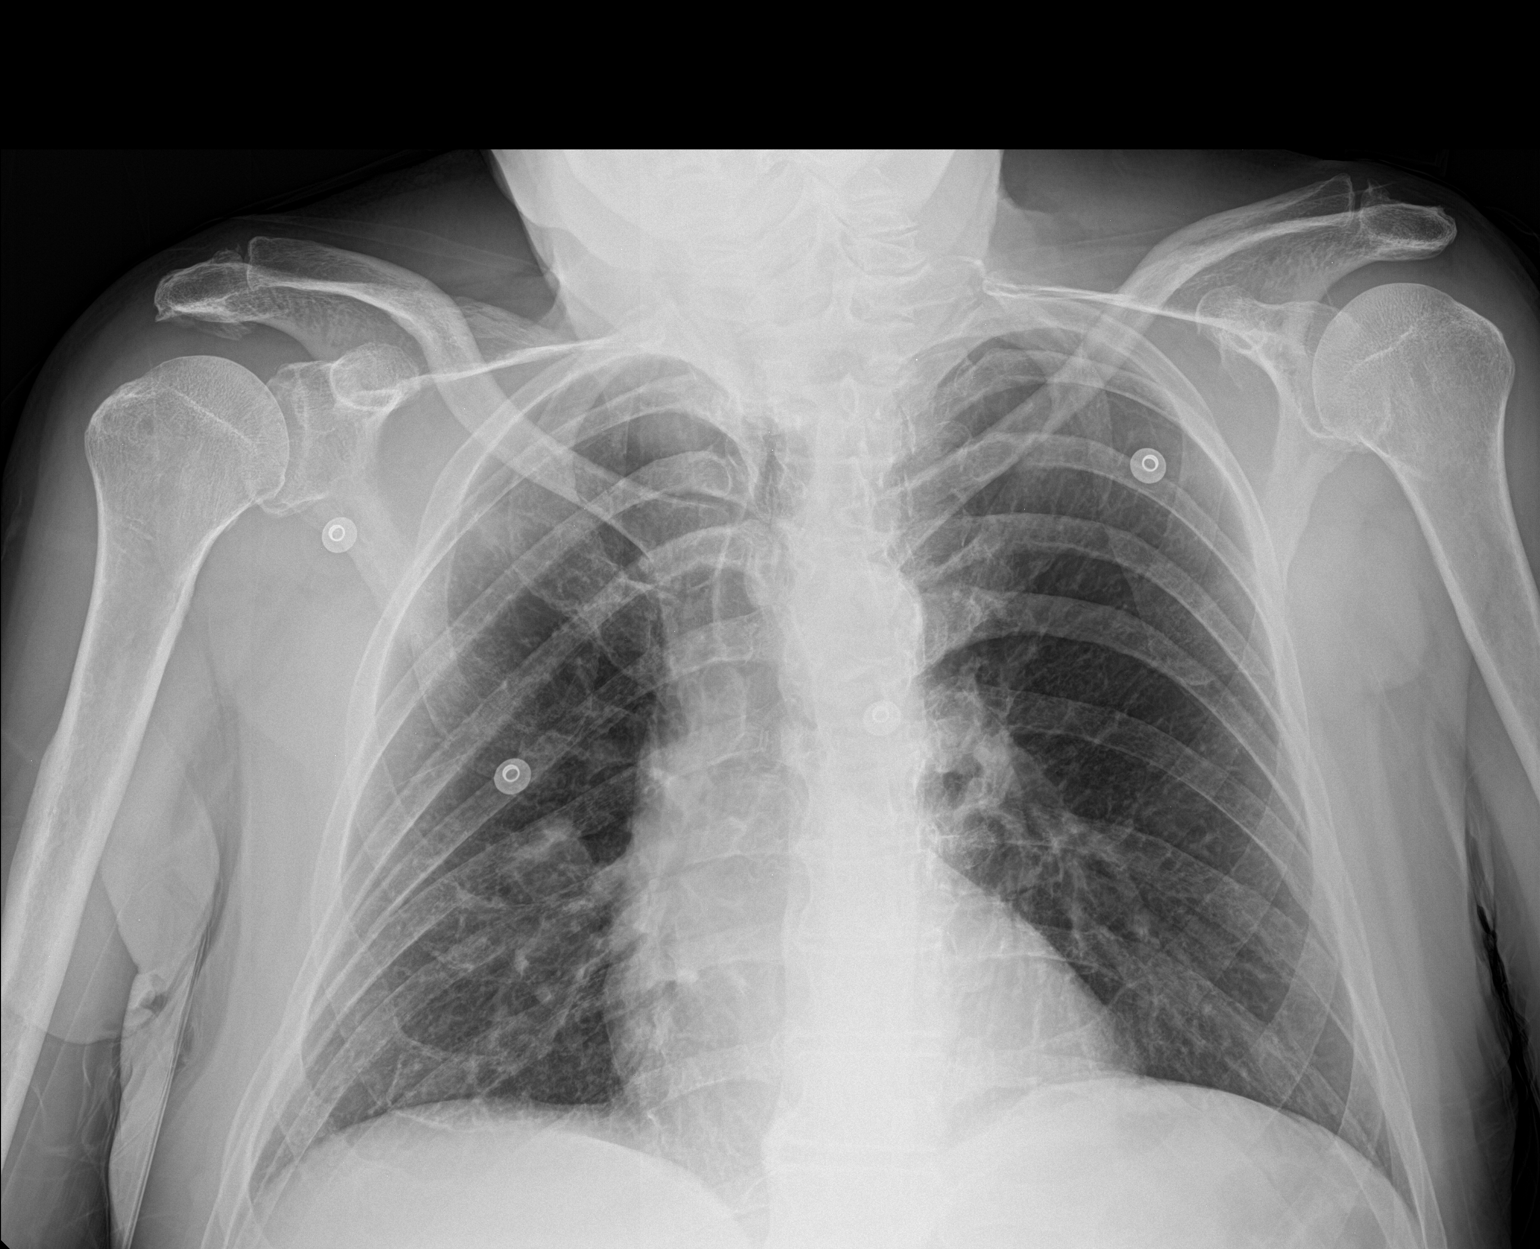

[1 of 1 positions shown; findings below may reference images not displayed]

FINDINGS: The heart size and mediastinal contours are within normal limits.
Both lungs are clear. Degenerative changes are seen in the spine.
IMPRESSION: No active disease.

## 2022-01-12 ENCOUNTER — Emergency Department (HOSPITAL_BASED_OUTPATIENT_CLINIC_OR_DEPARTMENT_OTHER)
Admission: EM | Admit: 2022-01-12 | Discharge: 2022-01-12 | Disposition: A | Discharge status: Other Acute Inpatient Hospital | Payer: Medicare HMO | Attending: Emergency Medicine | Admitting: Emergency Medicine

## 2022-01-12 ENCOUNTER — Encounter (HOSPITAL_BASED_OUTPATIENT_CLINIC_OR_DEPARTMENT_OTHER): Payer: Self-pay | Admitting: Emergency Medicine

## 2022-01-12 ENCOUNTER — Emergency Department (HOSPITAL_BASED_OUTPATIENT_CLINIC_OR_DEPARTMENT_OTHER): Payer: Medicare HMO

## 2022-01-12 DIAGNOSIS — R0602 Shortness of breath: Secondary | ICD-10-CM | POA: Diagnosis present

## 2022-01-12 DIAGNOSIS — Z9981 Dependence on supplemental oxygen: Secondary | ICD-10-CM | POA: Diagnosis not present

## 2022-01-12 DIAGNOSIS — D62 Acute posthemorrhagic anemia: Secondary | ICD-10-CM | POA: Diagnosis not present

## 2022-01-12 DIAGNOSIS — D696 Thrombocytopenia, unspecified: Secondary | ICD-10-CM | POA: Diagnosis not present

## 2022-01-12 DIAGNOSIS — Z781 Physical restraint status: Secondary | ICD-10-CM | POA: Diagnosis not present

## 2022-01-12 DIAGNOSIS — Z4682 Encounter for fitting and adjustment of non-vascular catheter: Secondary | ICD-10-CM | POA: Diagnosis not present

## 2022-01-12 DIAGNOSIS — D649 Anemia, unspecified: Secondary | ICD-10-CM

## 2022-01-12 DIAGNOSIS — E871 Hypo-osmolality and hyponatremia: Secondary | ICD-10-CM | POA: Diagnosis not present

## 2022-01-12 DIAGNOSIS — J9601 Acute respiratory failure with hypoxia: Secondary | ICD-10-CM | POA: Diagnosis not present

## 2022-01-12 DIAGNOSIS — E441 Mild protein-calorie malnutrition: Secondary | ICD-10-CM | POA: Diagnosis not present

## 2022-01-12 DIAGNOSIS — C801 Malignant (primary) neoplasm, unspecified: Secondary | ICD-10-CM | POA: Diagnosis not present

## 2022-01-12 DIAGNOSIS — R918 Other nonspecific abnormal finding of lung field: Secondary | ICD-10-CM | POA: Diagnosis not present

## 2022-01-12 DIAGNOSIS — J9801 Acute bronchospasm: Secondary | ICD-10-CM | POA: Diagnosis not present

## 2022-01-12 DIAGNOSIS — B340 Adenovirus infection, unspecified: Secondary | ICD-10-CM | POA: Diagnosis not present

## 2022-01-12 DIAGNOSIS — Z6824 Body mass index (BMI) 24.0-24.9, adult: Secondary | ICD-10-CM | POA: Diagnosis not present

## 2022-01-12 DIAGNOSIS — R0489 Hemorrhage from other sites in respiratory passages: Secondary | ICD-10-CM | POA: Diagnosis not present

## 2022-01-12 DIAGNOSIS — E785 Hyperlipidemia, unspecified: Secondary | ICD-10-CM | POA: Diagnosis not present

## 2022-01-12 DIAGNOSIS — I110 Hypertensive heart disease with heart failure: Secondary | ICD-10-CM | POA: Diagnosis not present

## 2022-01-12 DIAGNOSIS — G4089 Other seizures: Secondary | ICD-10-CM | POA: Diagnosis not present

## 2022-01-12 DIAGNOSIS — Z515 Encounter for palliative care: Secondary | ICD-10-CM | POA: Diagnosis not present

## 2022-01-12 DIAGNOSIS — D638 Anemia in other chronic diseases classified elsewhere: Secondary | ICD-10-CM | POA: Diagnosis not present

## 2022-01-12 DIAGNOSIS — I493 Ventricular premature depolarization: Secondary | ICD-10-CM | POA: Diagnosis not present

## 2022-01-12 DIAGNOSIS — C7801 Secondary malignant neoplasm of right lung: Secondary | ICD-10-CM | POA: Diagnosis not present

## 2022-01-12 DIAGNOSIS — I10 Essential (primary) hypertension: Secondary | ICD-10-CM | POA: Diagnosis not present

## 2022-01-12 DIAGNOSIS — I509 Heart failure, unspecified: Secondary | ICD-10-CM | POA: Diagnosis not present

## 2022-01-12 DIAGNOSIS — E039 Hypothyroidism, unspecified: Secondary | ICD-10-CM | POA: Diagnosis not present

## 2022-01-12 DIAGNOSIS — K5900 Constipation, unspecified: Secondary | ICD-10-CM | POA: Diagnosis not present

## 2022-01-12 DIAGNOSIS — R0603 Acute respiratory distress: Secondary | ICD-10-CM | POA: Diagnosis not present

## 2022-01-12 DIAGNOSIS — K219 Gastro-esophageal reflux disease without esophagitis: Secondary | ICD-10-CM | POA: Diagnosis not present

## 2022-01-12 DIAGNOSIS — C3491 Malignant neoplasm of unspecified part of right bronchus or lung: Secondary | ICD-10-CM | POA: Diagnosis not present

## 2022-01-12 DIAGNOSIS — Z66 Do not resuscitate: Secondary | ICD-10-CM | POA: Diagnosis not present

## 2022-01-12 DIAGNOSIS — Z7982 Long term (current) use of aspirin: Secondary | ICD-10-CM | POA: Diagnosis not present

## 2022-01-12 DIAGNOSIS — R000 Tachycardia, unspecified: Secondary | ICD-10-CM | POA: Diagnosis not present

## 2022-01-12 DIAGNOSIS — Z20822 Contact with and (suspected) exposure to covid-19: Secondary | ICD-10-CM | POA: Diagnosis not present

## 2022-01-12 DIAGNOSIS — J189 Pneumonia, unspecified organism: Secondary | ICD-10-CM | POA: Diagnosis not present

## 2022-01-12 DIAGNOSIS — R0902 Hypoxemia: Secondary | ICD-10-CM | POA: Diagnosis not present

## 2022-01-12 DIAGNOSIS — C4442 Squamous cell carcinoma of skin of scalp and neck: Secondary | ICD-10-CM | POA: Diagnosis not present

## 2022-01-12 DIAGNOSIS — C7800 Secondary malignant neoplasm of unspecified lung: Secondary | ICD-10-CM | POA: Diagnosis not present

## 2022-01-12 DIAGNOSIS — C4449 Other specified malignant neoplasm of skin of scalp and neck: Secondary | ICD-10-CM | POA: Diagnosis not present

## 2022-01-12 DIAGNOSIS — D65 Disseminated intravascular coagulation [defibrination syndrome]: Secondary | ICD-10-CM | POA: Diagnosis not present

## 2022-01-12 DIAGNOSIS — E8770 Fluid overload, unspecified: Secondary | ICD-10-CM | POA: Diagnosis not present

## 2022-01-12 DIAGNOSIS — R911 Solitary pulmonary nodule: Secondary | ICD-10-CM | POA: Diagnosis not present

## 2022-01-12 DIAGNOSIS — C7931 Secondary malignant neoplasm of brain: Secondary | ICD-10-CM | POA: Diagnosis not present

## 2022-01-12 DIAGNOSIS — S41112A Laceration without foreign body of left upper arm, initial encounter: Secondary | ICD-10-CM | POA: Diagnosis not present

## 2022-01-12 DIAGNOSIS — J9811 Atelectasis: Secondary | ICD-10-CM | POA: Diagnosis not present

## 2022-01-12 DIAGNOSIS — R059 Cough, unspecified: Secondary | ICD-10-CM | POA: Diagnosis not present

## 2022-01-12 LAB — BASIC METABOLIC PANEL
Anion gap: 7 (ref 5–15)
BUN: 25 mg/dL — ABNORMAL HIGH (ref 8–23)
CO2: 23 mmol/L (ref 22–32)
Calcium: 7.5 mg/dL — ABNORMAL LOW (ref 8.9–10.3)
Chloride: 99 mmol/L (ref 98–111)
Creatinine, Ser: 1.12 mg/dL (ref 0.61–1.24)
GFR, Estimated: >60 mL/min (ref >60)
Glucose, Bld: 120 mg/dL — ABNORMAL HIGH (ref 70–99)
Potassium: 3.7 mmol/L (ref 3.5–5.1)
Sodium: 129 mmol/L — ABNORMAL LOW (ref 135–145)

## 2022-01-12 LAB — CBC WITH DIFFERENTIAL/PLATELET
Abs Immature Granulocytes: 0.39 10*3/uL — ABNORMAL HIGH (ref 0.00–0.07)
Basophils Absolute: 0 10*3/uL (ref 0.0–0.1)
Basophils Relative: 0 %
Eosinophils Absolute: 0 10*3/uL (ref 0.0–0.5)
Eosinophils Relative: 0 %
HCT: 21.1 % — ABNORMAL LOW (ref 39.0–52.0)
Hemoglobin: 7 g/dL — ABNORMAL LOW (ref 13.0–17.0)
Immature Granulocytes: 4 %
Lymphocytes Relative: 5 %
Lymphs Abs: 0.5 10*3/uL — ABNORMAL LOW (ref 0.7–4.0)
MCH: 27.3 pg (ref 26.0–34.0)
MCHC: 33.2 g/dL (ref 30.0–36.0)
MCV: 82.4 fL (ref 80.0–100.0)
Monocytes Absolute: 0.8 10*3/uL (ref 0.1–1.0)
Monocytes Relative: 8 %
Neutro Abs: 8.8 10*3/uL — ABNORMAL HIGH (ref 1.7–7.7)
Neutrophils Relative %: 83 %
Platelets: 131 10*3/uL — ABNORMAL LOW (ref 150–400)
RBC: 2.56 MIL/uL — ABNORMAL LOW (ref 4.22–5.81)
RDW: 23.3 % — ABNORMAL HIGH (ref 11.5–15.5)
Smear Review: NORMAL
WBC: 10.5 10*3/uL (ref 4.0–10.5)
nRBC: 1 % — ABNORMAL HIGH (ref 0.0–0.2)

## 2022-01-12 LAB — RESP PANEL BY RT-PCR (FLU A&B, COVID) ARPGX2
Influenza A by PCR: NEGATIVE
Influenza B by PCR: NEGATIVE
SARS Coronavirus 2 by RT PCR: NEGATIVE

## 2022-01-12 LAB — BRAIN NATRIURETIC PEPTIDE: B Natriuretic Peptide: 230.9 pg/mL — ABNORMAL HIGH (ref 0.0–100.0)

## 2022-01-12 MED ORDER — IPRATROPIUM-ALBUTEROL 0.5-2.5 (3) MG/3ML IN SOLN
3.0000 mL | Freq: Once | RESPIRATORY_TRACT | Status: AC
  Administered 2022-01-12: 3 mL via RESPIRATORY_TRACT
  Filled 2022-01-12: qty 3

## 2022-01-12 MED ORDER — FUROSEMIDE 10 MG/ML IJ SOLN
40.0000 mg | Freq: Once | INTRAMUSCULAR | Status: AC
  Administered 2022-01-12: 40 mg via INTRAVENOUS
  Filled 2022-01-12: qty 4

## 2022-01-12 MED ORDER — METHYLPREDNISOLONE SODIUM SUCC 125 MG IJ SOLR
125.0000 mg | Freq: Once | INTRAMUSCULAR | Status: AC
  Administered 2022-01-12: 125 mg via INTRAVENOUS
  Filled 2022-01-12: qty 2

## 2022-01-12 NOTE — ED Notes (Signed)
Transportation at bedside preparing pt for transport.  ?

## 2022-01-12 NOTE — ED Provider Notes (Signed)
?[facility] DEPARTMENT ?Provider Note ? ? ?CSN: [id number] ?Arrival date & time: 01/12/22  0127 ? ?  ? ?History ? ?Chief Complaint  ?Patient presents with  ? Shortness of Breath  ? ? ?Mark Ewing is a 81 y.o. male. ? ?The history is provided by the patient.  ?Shortness of Breath ?He has history of hypertension, hyperlipidemia, hyponatremia, metastatic sarcoma and comes in with cough and shortness of breath which started yesterday and got worse today.  Cough is nonproductive.  He denies fever, chills, sweats.  He denies any chest pain.  He denies nausea, vomiting.  He denies any sick contacts.  EMS did note oxygen saturation in the mid 80s and he was placed on nasal oxygen.  He does have peripheral edema which is being treated with pressure dressings and elevation of his feet.  He has received cranial irradiation for his sarcoma, and is scheduled to start on chemotherapy in the next week. ?  ?Home Medications ?Prior to Admission medications   ?Medication Sig Start Date End Date Taking? Authorizing Provider  ?acetaminophen (TYLENOL) 500 MG tablet Take 1,000 mg by mouth every 6 (six) hours as needed for moderate pain.    [provider]  ?aspirin 81 MG tablet Take 81 mg by mouth daily.    [provider]  ?co-enzyme Q-10 50 MG capsule Take 100 mg by mouth daily.    [provider]  ?dexamethasone (DECADRON) 4 MG tablet Take 1 tablet (4 mg total) by mouth 4 (four) times daily. 11/27/21   Lucrezia Starch, MD  ?famotidine (PEPCID) 20 MG tablet Take 20 mg by mouth 2 (two) times daily.    [provider]  ?latanoprost (XALATAN) 0.005 % ophthalmic solution Place 1 drop into both eyes at bedtime. 03/23/21   [provider]  ?levETIRAcetam (KEPPRA) 500 MG tablet Take 1 tablet (500 mg total) by mouth 2 (two) times daily. 11/27/21   Lucrezia Starch, MD  ?levothyroxine (SYNTHROID) 100 MCG tablet Take 100 mcg by mouth daily before breakfast.    [provider]  ?loratadine (CLARITIN) 10 MG tablet Take 10 mg by mouth daily.    [provider]  ?Multiple Vitamins-Minerals (MULTIVITAMIN WITH MINERALS) tablet Take 1 tablet by mouth daily.    [provider]  ?Omega-3 1000 MG CAPS Take 1,000 mg by mouth daily.    [provider]  ?simvastatin (ZOCOR) 10 MG tablet Take 10 mg by mouth daily.    [provider]  ?verapamil (CALAN-SR) 180 MG CR tablet Take 180 mg by mouth 2 (two) times daily.     [provider]  ?   ? ?Allergies    ?Patient has no known allergies.   ? ?Review of Systems   ?Review of Systems  ?Respiratory:  Positive for shortness of breath.   ?All other systems reviewed and are negative. ? ?Physical Exam ?Updated Vital Signs ?BP 140/82 (BP Location: Right Arm)   Pulse 97   Temp 98.9 ?F (37.2 ?C) (Oral)   Resp (!) 28   Ht 5\' 8"  (1.727 m)   Wt 73.5 kg   SpO2 98%   BMI 24.63 kg/m?  ?Physical Exam ?Vitals and nursing note reviewed.  ?81 year old male, resting comfortably and in no acute distress. Vital signs are significant for rapid respiratory rate. Oxygen saturation is 98%, which is normal. ?Head is normocephalic and atraumatic. PERRLA, EOMI. Oropharynx is clear. ?Neck is nontender and supple without adenopathy or JVD. ?Back  is nontender and there is no CVA tenderness. ?Lungs have crackles about halfway up, more prominent on the left.  With tidal breaths, there are no wheezes or rhonchi.  With forced exhalation, wheezing is appreciated. ?Chest is nontender. ?Heart has regular rate and rhythm without murmur. ?Abdomen is soft, flat, nontender. ?Extremities have 2+ pedal and pretibial edema with Unna boots in place which are not removed, full range of motion is present. ?Skin is warm and dry without rash. ?Neurologic: Mental status is normal, cranial nerves are intact, moves all extremities equally. ? ?ED Results / Procedures / Treatments   ?Labs ?(all labs ordered are listed, but only abnormal results  are displayed) ?Labs Reviewed  ?CBC WITH DIFFERENTIAL/PLATELET - Abnormal; Notable for the following components:  ?    Result Value  ? RBC 2.56 (*)   ? Hemoglobin 7.0 (*)   ? HCT 21.1 (*)   ? RDW 23.3 (*)   ? Platelets 131 (*)   ? nRBC 1.0 (*)   ? Neutro Abs 8.8 (*)   ? Lymphs Abs 0.5 (*)   ? Abs Immature Granulocytes 0.39 (*)   ? All other components within normal limits  ?BASIC METABOLIC PANEL - Abnormal; Notable for the following components:  ? Sodium 129 (*)   ? Glucose, Bld 120 (*)   ? BUN 25 (*)   ? Calcium 7.5 (*)   ? All other components within normal limits  ?BRAIN NATRIURETIC PEPTIDE - Abnormal; Notable for the following components:  ? B Natriuretic Peptide 230.9 (*)   ? All other components within normal limits  ?RESP PANEL BY RT-PCR (FLU A&B, COVID) [id number]  ? ?Radiology ?DG Chest 2 View ? ?Result Date: 01/12/2022 ?CLINICAL DATA:  Cough and shortness of breath for 2 days, initial encounter EXAM: CHEST - 2 VIEW COMPARISON:  07/13/2021 FINDINGS: Cardiac shadow is stable. Bibasilar atelectatic changes are noted. Mild vascular congestion is seen as well. No sizable effusion is noted. No bony abnormality is seen. IMPRESSION: Bibasilar atelectasis and vascular congestion. Electronically Signed   By: Inez Catalina M.D.   On: 01/12/2022 02:47   ? ?Procedures ?Procedures  ? ? ?Medications Ordered in ED ?Medications  ?ipratropium-albuterol (DUONEB) 0.5-2.5 (3) MG/3ML nebulizer solution 3 mL (has no administration in time range)  ?methylPREDNISolone sodium succinate (SOLU-MEDROL) 125 mg/2 mL injection 125 mg (has no administration in time range)  ? ? ?ED Course/ Medical Decision Making/ A&P ?  ?                        ?Medical Decision Making ?Amount and/or Complexity of Data Reviewed ?Labs: ordered. ?Radiology: ordered. ? ?Risk ?Prescription drug management. ? ? ?Cough and dyspnea which seem most likely to be acute bronchitis, consider pneumonia.  Old records are reviewed confirming history of metastatic sarcoma with  pulmonary metastases.  Exam is consistent with bronchospasm, will give albuterol with ipratropium as well as initial dose of methylprednisolone.  Will check chest x-ray to rule out pneumonia.  We will check screening labs including electrolytes. ? ?Chest x-ray shows pulmonary vascular congestion consistent with heart failure.  I have independently viewed the image, and agree with radiologist's interpretation.  Labs showed mild hyponatremia which is not felt to be clinically significant, anemia with hemoglobin 7.0 which has been showing progressive drops over the last 3 weeks.  Mild thrombocytopenia is present which is improved compared with most recent.  These are felt to be secondary to bone marrow failure probably related  to metastatic cancer.  Because of edema and chest x-ray concerning for heart failure, BNP was obtained which is mildly elevated at 230.9.  He did not improve with albuterol and ipratropium, dyspnea is felt most likely to be secondary to heart failure.  Respiratory pathogen panel is negative for COVID and influenza.  He is given a dose of furosemide.  He continues to be oxygen dependent, will need to be admitted.  Family requests he be transferred to [facility] since that is where he is getting his cancer care.  Case is discussed with Dr. Mindi Junker at [facility] who agrees to accept the patient in transfer. ? ?CRITICAL CARE ?Performed by: Delora Fuel ?Total critical care time: 45 minutes ?Critical care time was exclusive of separately billable procedures and treating other patients. ?Critical care was necessary to treat or prevent imminent or life-threatening deterioration. ?Critical care was time spent personally by me on the following activities: development of treatment plan with patient and/or surrogate as well as nursing, discussions with consultants, evaluation of patient's response to treatment, examination of patient, obtaining history from  patient or surrogate, ordering and performing treatments and interventions, ordering and review of laboratory studies, ordering and review of radiographic studies, pulse oximetry and re-evaluation of patient's condi

## 2022-01-12 NOTE — ED Triage Notes (Signed)
Per EMS cough and SOB since yesterday. Called EMS earlier but refused transport. Worse tonight when trying to lay down had a coughing "fit" and Sat.dropped into the 80s during the coughing.  ? ?

## 2022-01-12 NOTE — ED Notes (Signed)
Transport given report will arrive in about 30 minutes.  ?

## 2022-01-12 NOTE — ED Notes (Signed)
Report given to [place] at [facility].  ?

## 2022-01-12 NOTE — ED Notes (Signed)
[facility] bed placement called to inform admission info ? ?[facility] room 608 ?Nurse #     [phone] ?Charge#    [phone]  ? ?[facility] dispatch called will send a truck after shift change. Est [id number] ?Dispatch#  [phone] ? ?

## 2022-01-13 DIAGNOSIS — E8770 Fluid overload, unspecified: Secondary | ICD-10-CM | POA: Diagnosis not present

## 2022-01-13 DIAGNOSIS — G4089 Other seizures: Secondary | ICD-10-CM | POA: Diagnosis not present

## 2022-01-13 DIAGNOSIS — E785 Hyperlipidemia, unspecified: Secondary | ICD-10-CM | POA: Diagnosis not present

## 2022-01-13 DIAGNOSIS — J9601 Acute respiratory failure with hypoxia: Secondary | ICD-10-CM | POA: Diagnosis not present

## 2022-01-13 DIAGNOSIS — D638 Anemia in other chronic diseases classified elsewhere: Secondary | ICD-10-CM | POA: Diagnosis not present

## 2022-01-13 DIAGNOSIS — J189 Pneumonia, unspecified organism: Secondary | ICD-10-CM | POA: Diagnosis not present

## 2022-01-13 DIAGNOSIS — Z9981 Dependence on supplemental oxygen: Secondary | ICD-10-CM | POA: Diagnosis not present

## 2022-01-13 DIAGNOSIS — C7931 Secondary malignant neoplasm of brain: Secondary | ICD-10-CM | POA: Diagnosis not present

## 2022-01-13 DIAGNOSIS — E039 Hypothyroidism, unspecified: Secondary | ICD-10-CM | POA: Diagnosis not present

## 2022-01-13 DIAGNOSIS — K219 Gastro-esophageal reflux disease without esophagitis: Secondary | ICD-10-CM | POA: Diagnosis not present

## 2022-01-13 DIAGNOSIS — C7800 Secondary malignant neoplasm of unspecified lung: Secondary | ICD-10-CM | POA: Diagnosis not present

## 2022-01-13 DIAGNOSIS — R0602 Shortness of breath: Secondary | ICD-10-CM | POA: Diagnosis not present

## 2022-01-13 DIAGNOSIS — C4449 Other specified malignant neoplasm of skin of scalp and neck: Secondary | ICD-10-CM | POA: Diagnosis not present

## 2022-01-13 DIAGNOSIS — D62 Acute posthemorrhagic anemia: Secondary | ICD-10-CM | POA: Diagnosis not present

## 2022-01-13 DIAGNOSIS — K5900 Constipation, unspecified: Secondary | ICD-10-CM | POA: Diagnosis not present

## 2022-01-13 DIAGNOSIS — R911 Solitary pulmonary nodule: Secondary | ICD-10-CM | POA: Diagnosis not present

## 2022-01-13 DIAGNOSIS — I10 Essential (primary) hypertension: Secondary | ICD-10-CM | POA: Diagnosis not present

## 2022-01-13 DIAGNOSIS — I493 Ventricular premature depolarization: Secondary | ICD-10-CM | POA: Diagnosis not present

## 2022-01-14 DIAGNOSIS — J9601 Acute respiratory failure with hypoxia: Secondary | ICD-10-CM | POA: Diagnosis not present

## 2022-01-14 DIAGNOSIS — R918 Other nonspecific abnormal finding of lung field: Secondary | ICD-10-CM | POA: Diagnosis not present

## 2022-01-14 DIAGNOSIS — I10 Essential (primary) hypertension: Secondary | ICD-10-CM | POA: Diagnosis not present

## 2022-01-14 DIAGNOSIS — E785 Hyperlipidemia, unspecified: Secondary | ICD-10-CM | POA: Diagnosis not present

## 2022-01-14 DIAGNOSIS — Z6824 Body mass index (BMI) 24.0-24.9, adult: Secondary | ICD-10-CM | POA: Diagnosis not present

## 2022-01-14 DIAGNOSIS — Z4682 Encounter for fitting and adjustment of non-vascular catheter: Secondary | ICD-10-CM | POA: Diagnosis not present

## 2022-01-14 DIAGNOSIS — K219 Gastro-esophageal reflux disease without esophagitis: Secondary | ICD-10-CM | POA: Diagnosis not present

## 2022-01-14 DIAGNOSIS — S41112A Laceration without foreign body of left upper arm, initial encounter: Secondary | ICD-10-CM | POA: Diagnosis not present

## 2022-01-14 DIAGNOSIS — J189 Pneumonia, unspecified organism: Secondary | ICD-10-CM | POA: Diagnosis not present

## 2022-01-14 DIAGNOSIS — D638 Anemia in other chronic diseases classified elsewhere: Secondary | ICD-10-CM | POA: Diagnosis not present

## 2022-01-14 DIAGNOSIS — E441 Mild protein-calorie malnutrition: Secondary | ICD-10-CM | POA: Diagnosis not present

## 2022-01-14 DIAGNOSIS — C801 Malignant (primary) neoplasm, unspecified: Secondary | ICD-10-CM | POA: Diagnosis not present

## 2022-01-14 DIAGNOSIS — C7931 Secondary malignant neoplasm of brain: Secondary | ICD-10-CM | POA: Diagnosis not present

## 2022-01-14 DIAGNOSIS — K5900 Constipation, unspecified: Secondary | ICD-10-CM | POA: Diagnosis not present

## 2022-01-14 DIAGNOSIS — E039 Hypothyroidism, unspecified: Secondary | ICD-10-CM | POA: Diagnosis not present

## 2022-01-14 DIAGNOSIS — R911 Solitary pulmonary nodule: Secondary | ICD-10-CM | POA: Diagnosis not present

## 2022-01-14 DIAGNOSIS — C7801 Secondary malignant neoplasm of right lung: Secondary | ICD-10-CM | POA: Diagnosis not present

## 2022-01-15 DIAGNOSIS — C7931 Secondary malignant neoplasm of brain: Secondary | ICD-10-CM | POA: Diagnosis not present

## 2022-01-15 DIAGNOSIS — I10 Essential (primary) hypertension: Secondary | ICD-10-CM | POA: Diagnosis not present

## 2022-01-15 DIAGNOSIS — K219 Gastro-esophageal reflux disease without esophagitis: Secondary | ICD-10-CM | POA: Diagnosis not present

## 2022-01-15 DIAGNOSIS — C7801 Secondary malignant neoplasm of right lung: Secondary | ICD-10-CM | POA: Diagnosis not present

## 2022-01-15 DIAGNOSIS — J9601 Acute respiratory failure with hypoxia: Secondary | ICD-10-CM | POA: Diagnosis not present

## 2022-01-15 DIAGNOSIS — B340 Adenovirus infection, unspecified: Secondary | ICD-10-CM | POA: Diagnosis not present

## 2022-01-15 DIAGNOSIS — R911 Solitary pulmonary nodule: Secondary | ICD-10-CM | POA: Diagnosis not present

## 2022-01-15 DIAGNOSIS — R918 Other nonspecific abnormal finding of lung field: Secondary | ICD-10-CM | POA: Diagnosis not present

## 2022-01-15 DIAGNOSIS — R0489 Hemorrhage from other sites in respiratory passages: Secondary | ICD-10-CM | POA: Diagnosis not present

## 2022-01-15 DIAGNOSIS — J189 Pneumonia, unspecified organism: Secondary | ICD-10-CM | POA: Diagnosis not present

## 2022-01-16 DIAGNOSIS — J189 Pneumonia, unspecified organism: Secondary | ICD-10-CM | POA: Diagnosis not present

## 2022-01-16 DIAGNOSIS — K219 Gastro-esophageal reflux disease without esophagitis: Secondary | ICD-10-CM | POA: Diagnosis not present

## 2022-01-16 DIAGNOSIS — E785 Hyperlipidemia, unspecified: Secondary | ICD-10-CM | POA: Diagnosis not present

## 2022-01-16 DIAGNOSIS — E039 Hypothyroidism, unspecified: Secondary | ICD-10-CM | POA: Diagnosis not present

## 2022-01-16 DIAGNOSIS — C7800 Secondary malignant neoplasm of unspecified lung: Secondary | ICD-10-CM | POA: Diagnosis not present

## 2022-01-16 DIAGNOSIS — C3491 Malignant neoplasm of unspecified part of right bronchus or lung: Secondary | ICD-10-CM | POA: Diagnosis not present

## 2022-01-16 DIAGNOSIS — D649 Anemia, unspecified: Secondary | ICD-10-CM | POA: Diagnosis not present

## 2022-01-16 DIAGNOSIS — C7931 Secondary malignant neoplasm of brain: Secondary | ICD-10-CM | POA: Diagnosis not present

## 2022-01-16 DIAGNOSIS — I10 Essential (primary) hypertension: Secondary | ICD-10-CM | POA: Diagnosis not present

## 2022-01-16 DIAGNOSIS — R911 Solitary pulmonary nodule: Secondary | ICD-10-CM | POA: Diagnosis not present

## 2022-01-16 DIAGNOSIS — J9601 Acute respiratory failure with hypoxia: Secondary | ICD-10-CM | POA: Diagnosis not present

## 2022-01-17 DIAGNOSIS — E039 Hypothyroidism, unspecified: Secondary | ICD-10-CM | POA: Diagnosis not present

## 2022-01-17 DIAGNOSIS — R911 Solitary pulmonary nodule: Secondary | ICD-10-CM | POA: Diagnosis not present

## 2022-01-17 DIAGNOSIS — C7931 Secondary malignant neoplasm of brain: Secondary | ICD-10-CM | POA: Diagnosis not present

## 2022-01-17 DIAGNOSIS — J9601 Acute respiratory failure with hypoxia: Secondary | ICD-10-CM | POA: Diagnosis not present

## 2022-01-17 DIAGNOSIS — C7800 Secondary malignant neoplasm of unspecified lung: Secondary | ICD-10-CM | POA: Diagnosis not present

## 2022-01-17 DIAGNOSIS — R0489 Hemorrhage from other sites in respiratory passages: Secondary | ICD-10-CM | POA: Diagnosis not present

## 2022-01-17 DIAGNOSIS — C7801 Secondary malignant neoplasm of right lung: Secondary | ICD-10-CM | POA: Diagnosis not present

## 2022-01-17 DIAGNOSIS — R918 Other nonspecific abnormal finding of lung field: Secondary | ICD-10-CM | POA: Diagnosis not present

## 2022-01-17 DIAGNOSIS — I10 Essential (primary) hypertension: Secondary | ICD-10-CM | POA: Diagnosis not present

## 2022-01-17 DIAGNOSIS — D649 Anemia, unspecified: Secondary | ICD-10-CM | POA: Diagnosis not present

## 2022-01-17 DIAGNOSIS — C3491 Malignant neoplasm of unspecified part of right bronchus or lung: Secondary | ICD-10-CM | POA: Diagnosis not present

## 2022-01-17 DIAGNOSIS — K219 Gastro-esophageal reflux disease without esophagitis: Secondary | ICD-10-CM | POA: Diagnosis not present

## 2022-01-17 DIAGNOSIS — E785 Hyperlipidemia, unspecified: Secondary | ICD-10-CM | POA: Diagnosis not present

## 2022-01-17 DIAGNOSIS — B340 Adenovirus infection, unspecified: Secondary | ICD-10-CM | POA: Diagnosis not present

## 2022-01-17 DIAGNOSIS — J189 Pneumonia, unspecified organism: Secondary | ICD-10-CM | POA: Diagnosis not present

## 2022-01-18 DIAGNOSIS — C7801 Secondary malignant neoplasm of right lung: Secondary | ICD-10-CM | POA: Diagnosis not present

## 2022-01-18 DIAGNOSIS — R0489 Hemorrhage from other sites in respiratory passages: Secondary | ICD-10-CM | POA: Diagnosis not present

## 2022-01-18 DIAGNOSIS — J189 Pneumonia, unspecified organism: Secondary | ICD-10-CM | POA: Diagnosis not present

## 2022-01-18 DIAGNOSIS — J9601 Acute respiratory failure with hypoxia: Secondary | ICD-10-CM | POA: Diagnosis not present

## 2022-01-18 DIAGNOSIS — I10 Essential (primary) hypertension: Secondary | ICD-10-CM | POA: Diagnosis not present

## 2022-01-18 DIAGNOSIS — K219 Gastro-esophageal reflux disease without esophagitis: Secondary | ICD-10-CM | POA: Diagnosis not present

## 2022-01-18 DIAGNOSIS — R918 Other nonspecific abnormal finding of lung field: Secondary | ICD-10-CM | POA: Diagnosis not present

## 2022-01-18 DIAGNOSIS — C7931 Secondary malignant neoplasm of brain: Secondary | ICD-10-CM | POA: Diagnosis not present

## 2022-01-18 DIAGNOSIS — B340 Adenovirus infection, unspecified: Secondary | ICD-10-CM | POA: Diagnosis not present

## 2022-01-18 DIAGNOSIS — R000 Tachycardia, unspecified: Secondary | ICD-10-CM | POA: Diagnosis not present

## 2022-02-15 DEATH — deceased

## 2022-02-24 IMAGING — CR DG CHEST 1V PORT
1 series · 1 of 1 positions shown · non-contrast
Comparison: 05/30/2021

CLINICAL DATA: Postop bronchoscopy, right upper lobe mass

EXAM:
PORTABLE CHEST 1 VIEW

[AP]
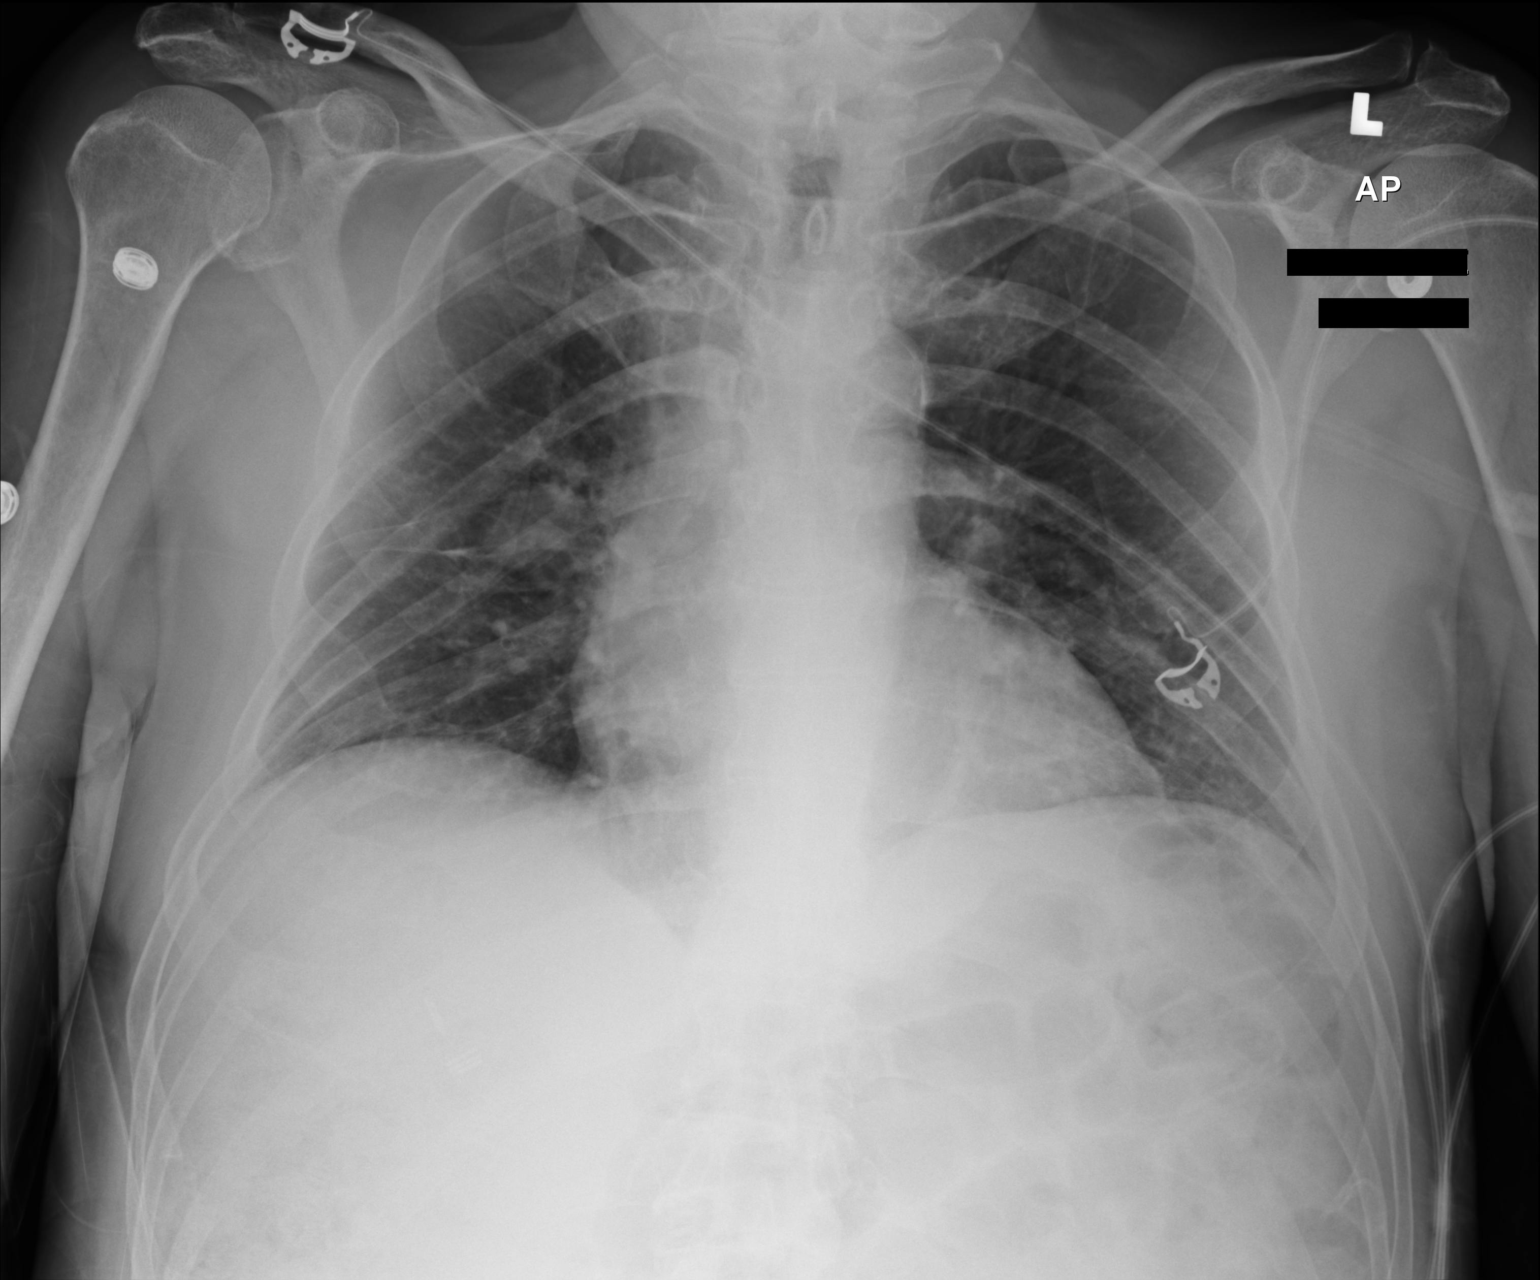

[1 of 1 positions shown; findings below may reference images not displayed]

FINDINGS: Single frontal view of the chest demonstrates an unremarkable
cardiac silhouette. The known right upper lobe mass overlies the
right hilum unchanged. No acute airspace disease, effusion, or
pneumothorax. No acute bony abnormality.
IMPRESSION: 1. No complication after bronchoscopy.  No pneumothorax.
2. Stable right upper lobe mass overlying the right hilum.
